# Patient Record
Sex: Male | Born: 1965 | Hispanic: Yes | Smoking: Never smoker
Health system: Southern US, Community
[De-identification: ages and names within clinical notes are randomized; demographics above are authoritative.]

## PROBLEM LIST (undated history)

## (undated) DIAGNOSIS — R69 Illness, unspecified: Secondary | ICD-10-CM

## (undated) DIAGNOSIS — F1020 Alcohol dependence, uncomplicated: Secondary | ICD-10-CM

## (undated) DIAGNOSIS — IMO0001 Reserved for inherently not codable concepts without codable children: Secondary | ICD-10-CM

## (undated) HISTORY — PX: OTHER SURGICAL HISTORY: SHX169

---

## 2010-01-05 ENCOUNTER — Emergency Department: Payer: Self-pay | Admitting: Internal Medicine

## 2011-02-10 ENCOUNTER — Emergency Department: Payer: Self-pay | Admitting: Emergency Medicine

## 2011-05-21 ENCOUNTER — Emergency Department: Payer: Self-pay | Admitting: *Deleted

## 2011-05-21 LAB — BASIC METABOLIC PANEL
BUN: 7 mg/dL (ref 7–18)
Calcium, Total: 8.8 mg/dL (ref 8.5–10.1)
Chloride: 104 mmol/L (ref 98–107)
Creatinine: 0.78 mg/dL (ref 0.60–1.30)
EGFR (African American): >60
EGFR (Non-African Amer.): >60
Glucose: 83 mg/dL (ref 65–99)
Osmolality: 286 (ref 275–301)
Potassium: 3.4 mmol/L — ABNORMAL LOW (ref 3.5–5.1)
Sodium: 145 mmol/L (ref 136–145)

## 2011-05-21 LAB — CBC
HCT: 39.9 % — ABNORMAL LOW (ref 40.0–52.0)
MCH: 20.3 pg — ABNORMAL LOW (ref 26.0–34.0)
Platelet: 248 10*3/uL (ref 150–440)
RDW: 18.9 % — ABNORMAL HIGH (ref 11.5–14.5)
WBC: 11.4 10*3/uL — ABNORMAL HIGH (ref 3.8–10.6)

## 2011-05-22 LAB — DRUG SCREEN, URINE
Barbiturates, Ur Screen: NEGATIVE (ref ?–200)
Benzodiazepine, Ur Scrn: NEGATIVE (ref ?–200)
Cannabinoid 50 Ng, Ur ~~LOC~~: NEGATIVE (ref ?–50)
Methadone, Ur Screen: NEGATIVE (ref ?–300)
Tricyclic, Ur Screen: NEGATIVE (ref ?–1000)

## 2011-05-22 LAB — URINALYSIS, COMPLETE
Bacteria: NONE SEEN
Bilirubin,UR: NEGATIVE
Glucose,UR: NEGATIVE mg/dL (ref 0–75)
Nitrite: NEGATIVE
Specific Gravity: 1.003 (ref 1.003–1.030)
Squamous Epithelial: NONE SEEN
WBC UR: <1 /HPF (ref 0–5)

## 2011-05-30 ENCOUNTER — Emergency Department: Payer: Self-pay | Admitting: Emergency Medicine

## 2011-05-30 LAB — URINALYSIS, COMPLETE
Bilirubin,UR: NEGATIVE
Blood: NEGATIVE
Glucose,UR: NEGATIVE mg/dL (ref 0–75)
Ketone: NEGATIVE
Ph: 5 (ref 4.5–8.0)
RBC,UR: <1 /HPF (ref 0–5)
Squamous Epithelial: NONE SEEN

## 2011-05-30 LAB — DRUG SCREEN, URINE
Amphetamines, Ur Screen: NEGATIVE (ref ?–1000)
Barbiturates, Ur Screen: NEGATIVE (ref ?–200)
Cannabinoid 50 Ng, Ur ~~LOC~~: NEGATIVE (ref ?–50)
Cocaine Metabolite,Ur ~~LOC~~: NEGATIVE (ref ?–300)
Methadone, Ur Screen: NEGATIVE (ref ?–300)
Opiate, Ur Screen: NEGATIVE (ref ?–300)
Phencyclidine (PCP) Ur S: NEGATIVE (ref ?–25)
Tricyclic, Ur Screen: NEGATIVE (ref ?–1000)

## 2011-05-30 LAB — CBC
HCT: 37.6 % — ABNORMAL LOW (ref 40.0–52.0)
HGB: 11.9 g/dL — ABNORMAL LOW (ref 13.0–18.0)
MCH: 20.6 pg — ABNORMAL LOW (ref 26.0–34.0)
MCV: 65 fL — ABNORMAL LOW (ref 80–100)
Platelet: 158 10*3/uL (ref 150–440)
RBC: 5.77 10*6/uL (ref 4.40–5.90)
WBC: 7.4 10*3/uL (ref 3.8–10.6)

## 2011-05-30 LAB — ETHANOL: Ethanol %: 0.383 % (ref 0.000–0.080)

## 2011-05-30 LAB — COMPREHENSIVE METABOLIC PANEL
Albumin: 4.1 g/dL (ref 3.4–5.0)
Alkaline Phosphatase: 53 U/L (ref 50–136)
Calcium, Total: 8.6 mg/dL (ref 8.5–10.1)
SGOT(AST): 34 U/L (ref 15–37)
SGPT (ALT): 19 U/L

## 2011-05-30 LAB — SALICYLATE LEVEL: Salicylates, Serum: <1.7 mg/dL

## 2011-05-30 LAB — TSH: Thyroid Stimulating Horm: 0.16 u[IU]/mL — ABNORMAL LOW

## 2011-05-30 LAB — ACETAMINOPHEN LEVEL: Acetaminophen: <2 ug/mL

## 2011-07-04 ENCOUNTER — Emergency Department: Payer: Self-pay | Admitting: Emergency Medicine

## 2011-07-05 LAB — ETHANOL: Ethanol: 474 mg/dL

## 2013-10-24 ENCOUNTER — Emergency Department: Payer: Self-pay | Admitting: Emergency Medicine

## 2013-10-24 LAB — URINALYSIS, COMPLETE
BACTERIA: NONE SEEN
BILIRUBIN, UR: NEGATIVE
Blood: NEGATIVE
Glucose,UR: NEGATIVE mg/dL (ref 0–75)
Ketone: NEGATIVE
Leukocyte Esterase: NEGATIVE
Nitrite: NEGATIVE
Ph: 6 (ref 4.5–8.0)
Protein: NEGATIVE
RBC,UR: NONE SEEN /HPF (ref 0–5)
SPECIFIC GRAVITY: 1.002 (ref 1.003–1.030)
SQUAMOUS EPITHELIAL: NONE SEEN
WBC UR: NONE SEEN /HPF (ref 0–5)

## 2013-10-24 LAB — CBC
HCT: 38.9 % — ABNORMAL LOW (ref 40.0–52.0)
HGB: 12.3 g/dL — ABNORMAL LOW (ref 13.0–18.0)
MCH: 22.8 pg — ABNORMAL LOW (ref 26.0–34.0)
MCHC: 31.7 g/dL — ABNORMAL LOW (ref 32.0–36.0)
MCV: 72 fL — ABNORMAL LOW (ref 80–100)
PLATELETS: 283 10*3/uL (ref 150–440)
RBC: 5.4 10*6/uL (ref 4.40–5.90)
RDW: 19.2 % — AB (ref 11.5–14.5)
WBC: 8.5 10*3/uL (ref 3.8–10.6)

## 2013-10-24 LAB — COMPREHENSIVE METABOLIC PANEL
ALBUMIN: 4 g/dL (ref 3.4–5.0)
ANION GAP: 14 (ref 7–16)
AST: 177 U/L — AB (ref 15–37)
Alkaline Phosphatase: 80 U/L
BILIRUBIN TOTAL: 0.3 mg/dL (ref 0.2–1.0)
BUN: 6 mg/dL — ABNORMAL LOW (ref 7–18)
CALCIUM: 8.5 mg/dL (ref 8.5–10.1)
Chloride: 98 mmol/L (ref 98–107)
Co2: 21 mmol/L (ref 21–32)
Creatinine: 0.76 mg/dL (ref 0.60–1.30)
EGFR (African American): >60
Glucose: 123 mg/dL — ABNORMAL HIGH (ref 65–99)
OSMOLALITY: 265 (ref 275–301)
Potassium: 3.3 mmol/L — ABNORMAL LOW (ref 3.5–5.1)
SGPT (ALT): 148 U/L — ABNORMAL HIGH
Sodium: 133 mmol/L — ABNORMAL LOW (ref 136–145)
TOTAL PROTEIN: 7.7 g/dL (ref 6.4–8.2)

## 2013-10-24 LAB — DRUG SCREEN, URINE
Amphetamines, Ur Screen: NEGATIVE (ref ?–1000)
BARBITURATES, UR SCREEN: NEGATIVE (ref ?–200)
Benzodiazepine, Ur Scrn: NEGATIVE (ref ?–200)
Cannabinoid 50 Ng, Ur ~~LOC~~: NEGATIVE (ref ?–50)
Cocaine Metabolite,Ur ~~LOC~~: NEGATIVE (ref ?–300)
MDMA (ECSTASY) UR SCREEN: NEGATIVE (ref ?–500)
Methadone, Ur Screen: NEGATIVE (ref ?–300)
Opiate, Ur Screen: NEGATIVE (ref ?–300)
Phencyclidine (PCP) Ur S: NEGATIVE (ref ?–25)
Tricyclic, Ur Screen: NEGATIVE (ref ?–1000)

## 2013-10-24 LAB — ETHANOL
ETHANOL %: 0.417 % — AB (ref 0.000–0.080)
Ethanol: 417 mg/dL

## 2014-05-07 ENCOUNTER — Emergency Department: Payer: Self-pay | Admitting: Emergency Medicine

## 2014-05-24 ENCOUNTER — Emergency Department: Payer: Self-pay | Admitting: Emergency Medicine

## 2014-09-25 ENCOUNTER — Emergency Department
Admission: EM | Admit: 2014-09-25 | Discharge: 2014-09-25 | Disposition: A | Discharge status: Home / Self Care | Payer: Self-pay | Attending: Emergency Medicine | Admitting: Emergency Medicine

## 2014-09-25 ENCOUNTER — Encounter: Payer: Self-pay | Admitting: Emergency Medicine

## 2014-09-25 DIAGNOSIS — F10120 Alcohol abuse with intoxication, uncomplicated: Secondary | ICD-10-CM | POA: Insufficient documentation

## 2014-09-25 DIAGNOSIS — F10920 Alcohol use, unspecified with intoxication, uncomplicated: Secondary | ICD-10-CM

## 2014-09-25 DIAGNOSIS — Z720 Tobacco use: Secondary | ICD-10-CM | POA: Insufficient documentation

## 2014-09-25 HISTORY — DX: Illness, unspecified: R69

## 2014-09-25 HISTORY — DX: Reserved for inherently not codable concepts without codable children: IMO0001

## 2014-09-25 HISTORY — DX: Alcohol dependence, uncomplicated: F10.20

## 2014-09-25 LAB — CBC
HCT: 36 % — ABNORMAL LOW (ref 40.0–52.0)
Hemoglobin: 11.3 g/dL — ABNORMAL LOW (ref 13.0–18.0)
MCH: 22.6 pg — ABNORMAL LOW (ref 26.0–34.0)
MCHC: 31.3 g/dL — ABNORMAL LOW (ref 32.0–36.0)
MCV: 72.1 fL — ABNORMAL LOW (ref 80.0–100.0)
Platelets: 147 10*3/uL — ABNORMAL LOW (ref 150–440)
RBC: 4.99 MIL/uL (ref 4.40–5.90)
RDW: 20.1 % — ABNORMAL HIGH (ref 11.5–14.5)
WBC: 7 10*3/uL (ref 3.8–10.6)

## 2014-09-25 LAB — COMPREHENSIVE METABOLIC PANEL
ALT: 53 U/L (ref 17–63)
AST: 194 U/L — AB (ref 15–41)
Albumin: 4.1 g/dL (ref 3.5–5.0)
Alkaline Phosphatase: 76 U/L (ref 38–126)
Anion gap: 12 (ref 5–15)
BUN: 8 mg/dL (ref 6–20)
CALCIUM: 8.7 mg/dL — AB (ref 8.9–10.3)
CO2: 25 mmol/L (ref 22–32)
CREATININE: 0.75 mg/dL (ref 0.61–1.24)
Chloride: 107 mmol/L (ref 101–111)
GFR calc Af Amer: >60 mL/min (ref >60)
Glucose, Bld: 107 mg/dL — ABNORMAL HIGH (ref 65–99)
Potassium: 4.1 mmol/L (ref 3.5–5.1)
Sodium: 144 mmol/L (ref 135–145)
TOTAL PROTEIN: 7.5 g/dL (ref 6.5–8.1)
Total Bilirubin: 0.7 mg/dL (ref 0.3–1.2)

## 2014-09-25 LAB — ACETAMINOPHEN LEVEL

## 2014-09-25 LAB — ETHANOL: Alcohol, Ethyl (B): 533 mg/dL (ref <5)

## 2014-09-25 LAB — SALICYLATE LEVEL: Salicylate Lvl: <4 mg/dL (ref 2.8–30.0)

## 2014-09-25 NOTE — ED Provider Notes (Signed)
Patient is ambulate well and is not clinically intoxicated, he is anxious to leave.  Jene Everyobert Pepper Kerrick, MD 09/25/14 1104

## 2014-09-25 NOTE — ED Notes (Signed)
Remains confused to time and place.  Speech is clearer.  Patient resting.

## 2014-09-25 NOTE — ED Notes (Signed)
Sleeping

## 2014-09-25 NOTE — ED Notes (Signed)
Through translator and Dr. Zenda AlpersWebster patient denies any medical complaints. States "I am drunk".  Would not state what or how much he has consumed.  Also stated that "I do not have a girlfriend".

## 2014-09-25 NOTE — Discharge Instructions (Signed)
Intoxicacin alcohlica (Alcohol Intoxication) La intoxicacin alcohlica se produce cuando la cantidad de alcohol que se ha consumido daa la capacidad de funcionamiento mental y fsico. El alcohol deteriora directamente la actividad qumica normal del cerebro. Beber grandes cantidades de alcohol puede conducir a Insurance underwriter funcionamiento mental y en el comportamiento, y puede causar muchos efectos fsicos que pueden ser perjudiciales.  La intoxicacin alcohlica puede variar en gravedad desde leve hasta muy grave. Hay varios factores que pueden afectar el nivel de intoxicacin que se produce, como la edad de la persona, el sexo, el peso, la frecuencia de consumo de alcohol, y la presencia de otras enfermedades mdicas (como diabetes, convulsiones o enfermedades del corazn). Los niveles peligrosos de intoxicacin por alcohol pueden ocurrir [employer] personas beben grandes cantidades de alcohol en un corto periodo de tiempo ([place]). El alcohol tambin puede ser especialmente peligroso cuando se combina con ciertos medicamentos recetados o drogas "recreativas". SIGNOS Y SNTOMAS Algunos de los signos y sntomas comunes de intoxicacin leve por alcohol incluyen:  Prdida de la coordinacin.  Cambios en el estado de nimo y la conducta.  Incapacidad para razonar.  Hablar arrastrando las palabras. A medida que la intoxicacin por alcohol avanza a niveles ms graves, Lucianne Lei a Arts administrator otros signos y sntomas. Estos pueden ser:  Vmitos.  Confusin y alteracin de Sales promotion account executive.  Disminucin de Secretary/administrator.  Convulsiones.  Prdida de la conciencia. DIAGNSTICO  El mdico le har una historia clnica y un examen fsico. Se le preguntar acerca de la cantidad y el tipo de alcohol que ha consumido. Se le realizarn anlisis de sangre para medir la concentracin de alcohol en sangre. En muchos lugares, el nivel de alcohol en la sangre debe ser inferior a 80 mg / dL (0,08%) para  poder conducir legalmente. Sin embargo, hay muchos efectos peligrosos del alcohol que pueden ocurrir con niveles mucho ms bajos.  [place] con intoxicacin por alcohol a menudo no requieren Clinical research associate. La mayor parte de los efectos de la intoxicacin por alcohol son temporales, y desaparecen a medida que el alcohol abandona el cuerpo de forma natural. El profesional controlar su estado hasta que est lo suficientemente estable como para volver a casa. A veces se administran lquidos por va intravenosa para ayudar a evitar la deshidratacin.  INSTRUCCIONES PARA EL CUIDADO EN EL HOGAR  No conduzca vehculos despus de beber alcohol.  Mantngase hidratado. Beba gran cantidad de lquido para mantener la orina de tono claro o color amarillo plido. Evite la cafena.   Tome slo medicamentos de venta libre o recetados, segn las indicaciones del mdico.  SOLICITE ATENCIN MDICA SI:   Tiene vmitos persistentes.   No mejora luego de [employer].  Se intoxica con alcohol con frecuencia. El mdico podr ayudarlo a decidir si debe consultar a un terapeuta especializado en el abuso de sustancias. SOLICITE ATENCIN MDICA DE INMEDIATO SI:   Se siente vacilante o tembloroso cuando trata de abandonar el hbito.   Comienza a temblar de manera incontrolable (convulsiones).   Vomita sangre. Puede ser sangre de color rojo brillante o similar al sedimento del caf negro.   Lollie Marrow en la materia fecal. Puede ser de color rojo brillante o de aspecto alquitranado, con olor ftido.   Se siente mareado o se desmaya.  ASEGRESE DE QUE:   Comprende estas instrucciones.  Controlar su afeccin.  Recibir ayuda de inmediato si no mejora o si empeora. Document Released: 03/07/2005 Document Revised: 11/07/2012 ExitCare Patient Information  2015 [employer]. This information is not intended to replace advice given to you by your health care provider. Make sure you  discuss any questions you have with your health care provider.
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
## 2014-12-13 ENCOUNTER — Encounter: Payer: Self-pay | Admitting: Emergency Medicine

## 2014-12-13 ENCOUNTER — Emergency Department: Payer: Self-pay

## 2014-12-13 ENCOUNTER — Emergency Department
Admission: EM | Admit: 2014-12-13 | Discharge: 2014-12-13 | Disposition: A | Discharge status: Home / Self Care | Payer: Self-pay | Attending: Student | Admitting: Student

## 2014-12-13 DIAGNOSIS — K852 Alcohol induced acute pancreatitis without necrosis or infection: Secondary | ICD-10-CM

## 2014-12-13 DIAGNOSIS — F10920 Alcohol use, unspecified with intoxication, uncomplicated: Secondary | ICD-10-CM

## 2014-12-13 DIAGNOSIS — F10120 Alcohol abuse with intoxication, uncomplicated: Secondary | ICD-10-CM | POA: Insufficient documentation

## 2014-12-13 DIAGNOSIS — R4781 Slurred speech: Secondary | ICD-10-CM | POA: Insufficient documentation

## 2014-12-13 LAB — COMPREHENSIVE METABOLIC PANEL WITH GFR
ALT: 32 U/L (ref 17–63)
AST: 60 U/L — ABNORMAL HIGH (ref 15–41)
Albumin: 4.4 g/dL (ref 3.5–5.0)
Alkaline Phosphatase: 80 U/L (ref 38–126)
Anion gap: 10 (ref 5–15)
BUN: 7 mg/dL (ref 6–20)
CO2: 23 mmol/L (ref 22–32)
Calcium: 9.1 mg/dL (ref 8.9–10.3)
Chloride: 108 mmol/L (ref 101–111)
Creatinine, Ser: 0.7 mg/dL (ref 0.61–1.24)
GFR calc Af Amer: >60 mL/min
GFR calc non Af Amer: >60 mL/min
Glucose, Bld: 101 mg/dL — ABNORMAL HIGH (ref 65–99)
Potassium: 3.4 mmol/L — ABNORMAL LOW (ref 3.5–5.1)
Sodium: 141 mmol/L (ref 135–145)
Total Bilirubin: 0.3 mg/dL (ref 0.3–1.2)
Total Protein: 8.1 g/dL (ref 6.5–8.1)

## 2014-12-13 LAB — CBC WITH DIFFERENTIAL/PLATELET
BASOS ABS: 0.1 10*3/uL (ref 0–0.1)
Basophils Relative: 1 %
Eosinophils Absolute: 1.4 10*3/uL — ABNORMAL HIGH (ref 0–0.7)
Eosinophils Relative: 15 %
HEMATOCRIT: 39 % — AB (ref 40.0–52.0)
HEMOGLOBIN: 12.3 g/dL — AB (ref 13.0–18.0)
LYMPHS ABS: 3.4 10*3/uL (ref 1.0–3.6)
LYMPHS PCT: 36 %
MCH: 23.1 pg — ABNORMAL LOW (ref 26.0–34.0)
MCHC: 31.6 g/dL — ABNORMAL LOW (ref 32.0–36.0)
MCV: 73.2 fL — AB (ref 80.0–100.0)
Monocytes Absolute: 0.7 10*3/uL (ref 0.2–1.0)
Monocytes Relative: 7 %
NEUTROS ABS: 3.9 10*3/uL (ref 1.4–6.5)
Neutrophils Relative %: 41 %
Platelets: 243 10*3/uL (ref 150–440)
RBC: 5.33 MIL/uL (ref 4.40–5.90)
RDW: 19.7 % — ABNORMAL HIGH (ref 11.5–14.5)
WBC: 9.5 10*3/uL (ref 3.8–10.6)

## 2014-12-13 LAB — LIPASE, BLOOD: Lipase: 289 U/L — ABNORMAL HIGH (ref 22–51)

## 2014-12-13 LAB — ETHANOL: Alcohol, Ethyl (B): 463 mg/dL

## 2014-12-13 MED ORDER — SODIUM CHLORIDE 0.9 % IV BOLUS (SEPSIS)
1000.0000 mL | Freq: Once | INTRAVENOUS | Status: AC
  Administered 2014-12-13: 1000 mL via INTRAVENOUS

## 2014-12-13 NOTE — ED Notes (Signed)
With assistance from Rafeal, translator, triage completed. ---- Patient presents to Emergency Department via EMS.  Pt found by [facility] on [address] sidewalk.  Pt denies falling, pt denies pain, pt reports drinking "1 or 2" beers, pt primary language unknown, interacts with Sherilyn Cooter, RN, pt appears extremely intoxicated and per EMS has no identification.  Pt won't report name or birthdate or any other information easily.

## 2014-12-13 NOTE — ED Notes (Signed)
Pt to toilet with assistance

## 2014-12-13 NOTE — ED Notes (Signed)
Patient is Alert but not oriented to Place or Event. Dr. Inocencio Homes at bedside. Patient is ambulatory without difficulty

## 2014-12-13 NOTE — ED Notes (Signed)
BEHAVIORAL HEALTH ROUNDING Patient sleeping: No. Patient alert and oriented: no Behavior appropriate: Yes.  ; If no, describe: See Note Nutrition and fluids offered: Yes  Toileting and hygiene offered: Yes  Sitter present: yes Law enforcement present: Yes  ENVIRONMENTAL ASSESSMENT Potentially harmful objects out of patient reach: Yes.   Personal belongings secured: Yes.   Patient dressed in hospital provided attire only: Yes.   Plastic bags out of patient reach: Yes.   Patient care equipment (cords, cables, call bells, lines, and drains) shortened, removed, or accounted for: Yes.   Equipment and supplies removed from bottom of stretcher: Yes.   Potentially toxic materials out of patient reach: Yes.   Sharps container removed or out of patient reach: Yes.

## 2014-12-13 NOTE — ED Provider Notes (Signed)
-----------------------------------------   8:36 AM on 12/13/2014 ----------------------------------------- Care assumed from Dr. Dolores Frame at 7:15 AM. Briefly, this is a 49 year old male who presented with acute alcohol intoxication. The patient is now awake, ambulatory, alert and oriented 4. He appears clinically sober. Discussed return precautions with the use of the spanish interpreter, counseled on alcohol cessation, DC with return precautions. Lipase was elevated on arrival however the patient is tolerating by mouth intake, he has no abdominal pain, no vomiting, no evidence to suggest clinically significant pancreatitis.  Gayla Doss, MD 12/13/14 [phone]

## 2014-12-13 NOTE — ED Notes (Signed)
Patient is A+Ox4. MD Inocencio Homes at bedside.
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
## 2014-12-13 NOTE — ED Notes (Signed)
Patient transported to CT 

## 2014-12-13 NOTE — ED Notes (Signed)
Pt filled urinal sitting with assistance

## 2014-12-13 NOTE — ED Notes (Signed)
BEHAVIORAL HEALTH ROUNDING Patient sleeping: No. Patient alert and oriented: No (Disoriented to Place) Behavior appropriate: Yes.  ; If no, describe: n/a Nutrition and fluids offered: Yes  Toileting and hygiene offered: Yes  Sitter present: yes Law enforcement present: Yes

## 2014-12-13 NOTE — ED Notes (Signed)
Dr Dolores Frame notified: 463 ETOH level

## 2014-12-14 ENCOUNTER — Emergency Department
Admission: EM | Admit: 2014-12-14 | Discharge: 2014-12-14 | Disposition: A | Discharge status: Home / Self Care | Payer: Self-pay | Attending: Emergency Medicine | Admitting: Emergency Medicine

## 2014-12-14 DIAGNOSIS — K852 Alcohol induced acute pancreatitis without necrosis or infection: Secondary | ICD-10-CM

## 2014-12-14 DIAGNOSIS — F10120 Alcohol abuse with intoxication, uncomplicated: Secondary | ICD-10-CM | POA: Insufficient documentation

## 2014-12-14 DIAGNOSIS — F10920 Alcohol use, unspecified with intoxication, uncomplicated: Secondary | ICD-10-CM

## 2014-12-14 DIAGNOSIS — K2920 Alcoholic gastritis without bleeding: Secondary | ICD-10-CM | POA: Insufficient documentation

## 2014-12-14 LAB — CBC
HEMATOCRIT: 37.2 % — AB (ref 40.0–52.0)
Hemoglobin: 11.7 g/dL — ABNORMAL LOW (ref 13.0–18.0)
MCH: 23.1 pg — ABNORMAL LOW (ref 26.0–34.0)
MCHC: 31.6 g/dL — ABNORMAL LOW (ref 32.0–36.0)
MCV: 73 fL — AB (ref 80.0–100.0)
PLATELETS: 220 10*3/uL (ref 150–440)
RBC: 5.09 MIL/uL (ref 4.40–5.90)
RDW: 20.3 % — AB (ref 11.5–14.5)
WBC: 6.2 10*3/uL (ref 3.8–10.6)

## 2014-12-14 LAB — LIPASE, BLOOD: LIPASE: 244 U/L — AB (ref 22–51)

## 2014-12-14 LAB — COMPREHENSIVE METABOLIC PANEL
ALT: 30 U/L (ref 17–63)
ANION GAP: 10 (ref 5–15)
AST: 64 U/L — AB (ref 15–41)
Albumin: 4.3 g/dL (ref 3.5–5.0)
Alkaline Phosphatase: 67 U/L (ref 38–126)
BILIRUBIN TOTAL: 0.4 mg/dL (ref 0.3–1.2)
BUN: 6 mg/dL (ref 6–20)
CHLORIDE: 112 mmol/L — AB (ref 101–111)
CO2: 20 mmol/L — ABNORMAL LOW (ref 22–32)
Calcium: 8.5 mg/dL — ABNORMAL LOW (ref 8.9–10.3)
Creatinine, Ser: 0.67 mg/dL (ref 0.61–1.24)
Glucose, Bld: 106 mg/dL — ABNORMAL HIGH (ref 65–99)
POTASSIUM: 3.6 mmol/L (ref 3.5–5.1)
Sodium: 142 mmol/L (ref 135–145)
TOTAL PROTEIN: 7.9 g/dL (ref 6.5–8.1)

## 2014-12-14 LAB — ETHANOL: ALCOHOL ETHYL (B): 473 mg/dL — AB (ref <5)

## 2014-12-14 MED ORDER — GI COCKTAIL ~~LOC~~: 30.0000 mL | Freq: Once | ORAL | Status: DC

## 2014-12-14 MED ORDER — SODIUM CHLORIDE 0.9 % IV BOLUS (SEPSIS)
1000.0000 mL | Freq: Once | INTRAVENOUS | Status: AC
  Administered 2014-12-14: 1000 mL via INTRAVENOUS

## 2014-12-14 NOTE — ED Notes (Signed)
Pt informed to return if any life threatening symptoms occur.  
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
## 2014-12-14 NOTE — ED Provider Notes (Signed)
Patient's care was signed out to Dr. Scotty Court.  Rebecka Apley, MD 12/14/14 [phone]

## 2014-12-14 NOTE — ED Notes (Signed)
BEHAVIORAL HEALTH ROUNDING Patient sleeping: Yes.   Patient alert and oriented: not applicable Behavior appropriate: Yes.  ; If no, describe: sleeping Nutrition and fluids offered: No Toileting and hygiene offered: No Sitter present: yes Law enforcement present: Yes   ENVIRONMENTAL ASSESSMENT Potentially harmful objects out of patient reach: Yes.   Personal belongings secured: Yes.   Patient dressed in hospital provided attire only: Yes.   Plastic bags out of patient reach: Yes.   Patient care equipment (cords, cables, call bells, lines, and drains) shortened, removed, or accounted for: Yes.   Equipment and supplies removed from bottom of stretcher: Yes.   Potentially toxic materials out of patient reach: Yes.   Sharps container removed or out of patient reach: Yes.    

## 2014-12-14 NOTE — ED Provider Notes (Signed)
-----------------------------------------   9:17 AM on 12/14/2014 -----------------------------------------  Patient awake alert oriented. Vital signs remained stable and normal. Speaks with clear speech, clear cognition. Interactive appropriately. Walks with steady gait. Tolerating oral intake and drinking coffee. At this point it appears he is clinically sober and medically clear. Does not appear that he has any clinically significant pancreatitis and likely his complaints were related to gastritis from his excessive alcohol intake. We'll discharge home with resources to follow-up to seek help with his alcohol abuse when he is ready.  Final diagnoses:  Alcohol intoxication, uncomplicated  Alcohol-induced acute pancreatitis  alcoholic gastritis   Sharman Cheek, MD 12/14/14 [phone]

## 2014-12-14 NOTE — ED Notes (Signed)
Pt is awake and walking down hall without difficulty.

## 2014-12-14 NOTE — Discharge Instructions (Signed)
Intoxicacin alcohlica (Alcohol Intoxication) La intoxicacin alcohlica se produce cuando la cantidad de alcohol que se ha consumido daa la capacidad de funcionamiento mental y fsico. El alcohol deteriora directamente la actividad qumica normal del cerebro. Beber grandes cantidades de alcohol puede conducir a Insurance underwriter funcionamiento mental y en el comportamiento, y puede causar muchos efectos fsicos que pueden ser perjudiciales.  La intoxicacin alcohlica puede variar en gravedad desde leve hasta muy grave. Hay varios factores que pueden afectar el nivel de intoxicacin que se produce, como la edad de la persona, el sexo, el peso, la frecuencia de consumo de alcohol, y la presencia de otras enfermedades mdicas (como diabetes, convulsiones o enfermedades del corazn). Los niveles peligrosos de intoxicacin por alcohol pueden ocurrir [employer] personas beben grandes cantidades de alcohol en un corto periodo de tiempo ([place]). El alcohol tambin puede ser especialmente peligroso cuando se combina con ciertos medicamentos recetados o drogas "recreativas". SIGNOS Y SNTOMAS Algunos de los signos y sntomas comunes de intoxicacin leve por alcohol incluyen:  Prdida de la coordinacin.  Cambios en el estado de nimo y la conducta.  Incapacidad para razonar.  Hablar arrastrando las palabras. A medida que la intoxicacin por alcohol avanza a niveles ms graves, Lucianne Lei a Arts administrator otros signos y sntomas. Estos pueden ser:  Vmitos.  Confusin y alteracin de Sales promotion account executive.  Disminucin de Secretary/administrator.  Convulsiones.  Prdida de la conciencia. DIAGNSTICO  El mdico le har una historia clnica y un examen fsico. Se le preguntar acerca de la cantidad y el tipo de alcohol que ha consumido. Se le realizarn anlisis de sangre para medir la concentracin de alcohol en sangre. En muchos lugares, el nivel de alcohol en la sangre debe ser inferior a 80 mg / dL (0,08%) para  poder conducir legalmente. Sin embargo, hay muchos efectos peligrosos del alcohol que pueden ocurrir con niveles mucho ms bajos.  [place] con intoxicacin por alcohol a menudo no requieren Clinical research associate. La mayor parte de los efectos de la intoxicacin por alcohol son temporales, y desaparecen a medida que el alcohol abandona el cuerpo de forma natural. El profesional controlar su estado hasta que est lo suficientemente estable como para volver a casa. A veces se administran lquidos por va intravenosa para ayudar a evitar la deshidratacin.  INSTRUCCIONES PARA EL CUIDADO EN EL HOGAR  No conduzca vehculos despus de beber alcohol.  Mantngase hidratado. Beba gran cantidad de lquido para mantener la orina de tono claro o color amarillo plido. Evite la cafena.   Tome slo medicamentos de venta libre o recetados, segn las indicaciones del mdico.  SOLICITE ATENCIN MDICA SI:   Tiene vmitos persistentes.   No mejora luego de [employer].  Se intoxica con alcohol con frecuencia. El mdico podr ayudarlo a decidir si debe consultar a un terapeuta especializado en el abuso de sustancias. SOLICITE ATENCIN MDICA DE INMEDIATO SI:   Se siente vacilante o tembloroso cuando trata de abandonar el hbito.   Comienza a temblar de manera incontrolable (convulsiones).   Vomita sangre. Puede ser sangre de color rojo brillante o similar al sedimento del caf negro.   Lollie Marrow en la materia fecal. Puede ser de color rojo brillante o de aspecto alquitranado, con olor ftido.   Se siente mareado o se desmaya.  ASEGRESE DE QUE:   Comprende estas instrucciones.  Controlar su afeccin.  Recibir ayuda de inmediato si no mejora o si empeora. Document Released: 03/07/2005 Document Revised: 11/07/2012 ExitCare Patient Information  2015 [employer]. This information is not intended to replace advice given to you by your health care provider. Make sure you  discuss any questions you have with your health care provider.  Trastorno por abuso de alcohol (Alcohol Use Disorder) El trastorno por abuso de alcohol es un trastorno mental. No se trata de un incidente nico en el que se bebe en abundancia. Este trastorno se debe al consumo incontrolable del alcohol a lo largo del [place], que causa dificultades en una o ms reas de la vida diaria. Al consumir en exceso, las personas que sufren este trastorno estn en riesgo de daarse a s mismos y a Lobbyist. Tambin puede causar otros trastornos [place], como trastornos del [place] de nimo y trastornos de [place], y 59 fsicos graves. Las personas que sufren trastornos por consumo de alcohol, generalmente abusan de otras sustancias.  Los trastornos por consumo de alcohol son frecuentes y se han generalizado. Algunas personas beben alcohol para poder enfrentarse o escapar a las situaciones negativas de la vida. Otros beben para Best boy crnico o los sntomas de una enfermedad mental. Las personas con historia familiar de trastornos por consumo de alcohol tienen ms riesgo de perder el control y de consumir alcohol en exceso.  SNTOMAS  Los signos y sntomas pueden ser:   Consumo dealcohol engrandes cantidades o durante perodos ms prolongados que lo previsto.  Mltiples intentos fallidos de dejar de bebero controlar el consumo de alcohol.   Emplear gran cantidad de tiempo para obtener, consumir o recuperarse de los efectos del alcohol (resaca).  Un deseo intenso o necesidad de consumir alcohol (abstinencia).   Continuar consumiendo alcohol a pesar de los [employer], en la escuela o en el hogar debido al consumo.   Continuar consumiendo a Arboriculturist de [employer] de relacin debido al consumo.  Continuar consumiendo an en situaciones que impliquen un peligro fsico, como al conducir un vehculo.  Continuar consumiendo a pesar de saber que un problema fsico o  psicolgico probablemente se relaciones con el consumo de alcohol. Los problemas fsicos relacionados con el consumo de alcohol pueden producirse en el cerebro, el corazn, el hgado, el estmago y los intestinos. Los problemas psicolgicos relacionados con el consumo de alcohol incluyen intoxicacin, depresin, ansiedad, psicosis, delirio y Air cabin crew.   La necesidad de aumentar la cantidad de alcohol para Control and instrumentation engineer deseado, o una disminucin del efecto al consumir la misma cantidad de alcohol (tolerancia).  Sndrome de abstinencia al reducir o Architectural technologist consumo, o tener que consumir alcohol para reducir o English as a second language teacher sndrome de abstinencia. Los sntomas de la abstinencia incluyen:  Frecuencia cardaca acelerada.  Temblores en las manos.  Dificultad para dormir.  Nuseas.  Vmitos.  Alucinaciones.  Agitacin.  Convulsiones. DIAGNSTICO  El mdico es el encargado de diagnosticar el trastorno por consumo de alcohol. Comenzar hacindole tres o cuatro preguntas para evaluar si consume alcohol en forma excesiva o si representa un problema. Para confirmar el diagnstico de trastorno por consumo de alcohol, deben estar presentes al [place] un perodo de 12 meses. La gravedad del trastorno depende del nmero de sntomas:   Leve--dos o tres.  Moderado--cuatro o cinco.  Grave--seis o ms. El mdico podr Water quality scientist un examen fsico o [employer] los [place] de las pruebas de laboratorio para ver si usted tiene problemas fsicos como resultado del consumo de alcohol. Podr derivarlo a Civil Service fast streamer de la salud mental para que realice una evaluacin.  TRATAMIENTO  Algunas  personas podrn reducir Psychologist, forensic a niveles en los que haya un bajo riesgo. Otras personas deben dejar de beber. Cuando sea necesario, podr recibir ayuda de los profesionales de la salud mental, capacitados en el tratamiento del abuso de sustancias. El mdico podr informarlo sobre la gravedad de su  trastorno por consumo de alcohol y decidir qu tipo de tratamiento necesita. Las formas de tratamiento disponibles son:   Desintoxicacin. La desintoxicacin consiste en el uso de medicamentos recetados para evitar el sndrome de abstinencia durante la primera semana en la que se deja de consumir. Esto es importante para las personas con historia de sndrome de abstinencia y para los que consumen en exceso, quienes probablemente sufran el sndrome de abstinencia. El sndrome de abstinencia puede ser peligroso y en algunos casos puede causar la [place]. La desintoxicacin generalmente se realiza internado en un hospital o en una institucin para pacientes que abusan de sustancias.  Asesoramiento psicolgico o psicoterapia. La psicoterapia la realizan terapeutas especializados en el tratamiento de pacientes que abusan de sustancias. Se evalan los motivos que llevan a consumir alcohol, y los modos para Furniture conservator/restorer a consumir. Los [employer] de la psicoterapia son ayudar a que las personas con trastornos por consumo de alcohol encuentren actividades saludables y [employer] de Radio broadcast assistant estrs de la vida, a identificar y Programmer, applications disparadores que originan el consumo de alcohol y a Scientist, physiological abstinencia, que puede originar una recada.  Medicamentos.Hay diferentes medicamentos que pueden ayudar a tratar el trastorno por consumo de alcohol a travs de los siguientes efectos:  Controlan la ansiedad por consumir.  Disminuyen la respuesta de recompensa positiva que se siente al consumir.  Causan una reaccin fsica desagradable cuando se consume alcohol (terapia por aversin).  Grupos de apoyo. Los grupos de apoyo son dirigidos por personas que han dejado de consumir. Ellos brindan apoyo emocional, consejo y [place]. Estas formas de tratamiento generalmente se combinan. Algunas personas se benefician con el tratamiento combinado intensivo que se ofrece en algunos centros de tratamiento especializados en el  abuso de sustancias. Existen programas para pacientes hospitalizados o ambulatorios.  Document Released: 03/07/2005 Document Revised: 07/22/2013 [facility] Patient Information 2015 [place], [place]. This information is not intended to replace advice given to you by your health care provider. Make sure you discuss any questions you have with your health care provider.  Pancreatitis aguda  (Acute Pancreatitis)  La pancreatitis aguda es una enfermedad en la que el pncreas se irrita (inflama) de manera sbita. El pncreas es una glndula grande ubicada detrs del [place]. Produce enzimas que ayudan a digerir los alimentos. Tambin produce 2 tipos de hormonas que controlan el nivel de Banker. La pancreatitis aguda se produce cuando las enzimas atacan y daan el pncreas. La mayor parte de los ataques duran un par de [address]  [address] y pueden ocasionar problemas graves. CUIDADOS EN EL HOGAR   Siga las instrucciones de su mdico sobre la dieta. Es necesario que evite el consumo de alcohol y grasas en la dieta.  Consuma pequeas cantidades de comida con ms frecuencia.  Beba gran cantidad de lquido para mantener el pis (orina) de tono claro o amarillo plido.  Tome slo los medicamentos que le haya indicado el mdico.  Evite el consumo de alcohol si esta fue la causa de su enfermedad.  No fume.  Haga reposo.  Controle su nivel de Production assistant, radio en su casa tal como le indic el mdico.  [place] con los controles mdicos segn las indicaciones. SOLICITE  AYUDA SI:  No mejora en el tiempo en que se esperaba.  Tiene sntomas nuevos o graves.  Tiene dolor persistente, debilidad o siente ganas de vomitar (nuseas).  Comienza a mejorar y sufre un nuevo ataque de Engineer, mining. SOLICITE AYUDA DE INMEDIATO SI:   No puede comer o retener los lquidos.  El dolor se hace ms intenso.  Tiene fiebre o sntomas que persisten durante ms de 2 o 3 das.  Tiene fiebre y los sntomas empeoran  bruscamente.  Nota que la piel o la zona blanca del ojo estn amarillas (ictericia).  Vomita.  Se siente mareado o se desvanece (se desmaya).  Su nivel de azcar en sangre es alto (ms de 300 mg/dL). ASEGRESE DE QUE:   Comprende estas instrucciones.  Controlar su enfermedad.  Solicitar ayuda de inmediato si no mejora o si empeora. Document Released: 09/06/2011 Document Revised: 07/22/2013 [facility] Patient Information 2015 [place], [place]. This information is not intended to replace advice given to you by your health care provider. Make sure you discuss any questions you have with your health care provider.

## 2014-12-14 NOTE — ED Provider Notes (Signed)
[facility] Emergency Department Provider Note  ____________________________________________  Time seen: Approximately 220 AM  I have reviewed the triage vital signs and the nursing notes.   HISTORY  Chief Complaint Alcohol Intoxication  History limited by patient alcohol intoxication  HPI Justin Schneider is a 49 y.o. male who was seen last night with alcohol intoxication and returns tonight with the same. According to the police the patient was found sleeping in someone's front yard. The patient had some slurred speech and had numerous empty beer cans around him. The patient was brought in for evaluation. He reports that he does have some upper epigastric/left upper quadrant pain he is not suicidal or homicidal at this time. The patient is very intoxicated and unable to give a history.   Past Medical History  Diagnosis Date  . Unknown and unspecified causes of morbidity   . Alcoholism /alcohol abuse     There are no active problems to display for this patient.   Past Surgical History  Procedure Laterality Date  . Unknown      No current outpatient prescriptions on file.  Allergies Review of patient's allergies indicates no known allergies.  No family history on file.  Social History Social History  Substance Use Topics  . Smoking status: Unknown If Ever Smoked  . Smokeless tobacco: Not on file  . Alcohol Use: Yes    Review of Systems  Gastrointestinal: Abdominal pain nausea and vomiting The patient is intoxicated and unable to give a good review of systems.  ____________________________________________   PHYSICAL EXAM:  VITAL SIGNS: ED Triage Vitals  Enc Vitals Group     BP 12/14/14 0056 125/85 mmHg     Pulse Rate 12/14/14 0441 84     Resp 12/14/14 0056 18     Temp 12/14/14 0056 98 F (36.7 C)     Temp Source 12/14/14 0056 Oral     SpO2 12/14/14 0056 96 %     Weight 12/14/14 0056 134 lb 7.7 oz (61 kg)     Height 12/14/14  0056  (1.6 m)     Head Cir --      Peak Flow --      Pain Score 12/14/14 0059 10     Pain Loc --      Pain Edu? --      Excl. in GC? --     Constitutional: Somnolent but arousable and has difficulty participating in history Eyes: Conjunctivae are normal. PERRL. EOMI. Head: Atraumatic. Nose: No congestion/rhinnorhea. Mouth/Throat: Mucous membranes are moist.  Oropharynx non-erythematous. Cardiovascular: Normal rate, regular rhythm. Grossly normal heart sounds.  Good peripheral circulation. Respiratory: Normal respiratory effort.  No retractions. Lungs CTAB. Gastrointestinal: Soft with epigastric and left upper quadrant tenderness to palpation No distention. Positive bowel sounds Musculoskeletal: No lower extremity tenderness nor edema.  . Neurologic:  Normal speech and language.  Skin:  Skin is warm, dry and intact.  Psychiatric: Mood and affect are normal.   ____________________________________________   LABS (all labs ordered are listed, but only abnormal results are displayed)  Labs Reviewed  CBC - Abnormal; Notable for the following:    Hemoglobin 11.7 (*)    HCT 37.2 (*)    MCV 73.0 (*)    MCH 23.1 (*)    MCHC 31.6 (*)    RDW 20.3 (*)    All other components within normal limits  COMPREHENSIVE METABOLIC PANEL - Abnormal; Notable for the following:    Chloride 112 (*)  CO2 20 (*)    Glucose, Bld 106 (*)    Calcium 8.5 (*)    AST 64 (*)    All other components within normal limits  ETHANOL - Abnormal; Notable for the following:    Alcohol, Ethyl (B) 473 (*)    All other components within normal limits  LIPASE, BLOOD - Abnormal; Notable for the following:    Lipase 244 (*)    All other components within normal limits   ____________________________________________  EKG  None ____________________________________________  RADIOLOGY  None ____________________________________________   PROCEDURES  Procedure(s) performed: None  Critical Care  performed: No  ____________________________________________   INITIAL IMPRESSION / ASSESSMENT AND PLAN / ED COURSE  Pertinent labs & imaging results that were available during my care of the patient were reviewed by me and considered in my medical decision making (see chart for details).  This is a 49 year old male who comes in today with alcohol intoxication. The patient was seen yesterday with the same thing and discharged in the day after he metabolizes alcohol. The patient does have some epigastric pain and I did write for some GI cocktail but he was able to sleep in the emergency department. When the patient is awake we'll reassess him to determine if he is having worse pain or worsening symptoms of his pancreatitis.  The patient's care will be signed out to Dr. Derrill Kay who will reassess the patient once he has sobered. ____________________________________________   FINAL CLINICAL IMPRESSION(S) / ED DIAGNOSES  Final diagnoses:  Alcohol intoxication, uncomplicated  Alcohol-induced acute pancreatitis      Rebecka Apley, MD 12/14/14 [phone]

## 2014-12-14 NOTE — ED Notes (Signed)
Pt up to the bathroom with rover at side; back to bed; no requests or complaints

## 2016-02-07 ENCOUNTER — Emergency Department
Admission: EM | Admit: 2016-02-07 | Discharge: 2016-02-08 | Disposition: A | Discharge status: Home / Self Care | Payer: Self-pay | Attending: Emergency Medicine | Admitting: Emergency Medicine

## 2016-02-07 ENCOUNTER — Emergency Department: Payer: Self-pay

## 2016-02-07 ENCOUNTER — Encounter: Payer: Self-pay | Admitting: *Deleted

## 2016-02-07 DIAGNOSIS — Y92488 Other paved roadways as the place of occurrence of the external cause: Secondary | ICD-10-CM | POA: Insufficient documentation

## 2016-02-07 DIAGNOSIS — F10920 Alcohol use, unspecified with intoxication, uncomplicated: Secondary | ICD-10-CM

## 2016-02-07 DIAGNOSIS — Z23 Encounter for immunization: Secondary | ICD-10-CM | POA: Insufficient documentation

## 2016-02-07 DIAGNOSIS — Y999 Unspecified external cause status: Secondary | ICD-10-CM | POA: Insufficient documentation

## 2016-02-07 DIAGNOSIS — W1839XA Other fall on same level, initial encounter: Secondary | ICD-10-CM | POA: Insufficient documentation

## 2016-02-07 DIAGNOSIS — F10120 Alcohol abuse with intoxication, uncomplicated: Secondary | ICD-10-CM | POA: Insufficient documentation

## 2016-02-07 DIAGNOSIS — Y939 Activity, unspecified: Secondary | ICD-10-CM | POA: Insufficient documentation

## 2016-02-07 DIAGNOSIS — S0083XA Contusion of other part of head, initial encounter: Secondary | ICD-10-CM | POA: Insufficient documentation

## 2016-02-07 LAB — COMPREHENSIVE METABOLIC PANEL
ALBUMIN: 4.2 g/dL (ref 3.5–5.0)
ALT: 61 U/L (ref 17–63)
ANION GAP: 10 (ref 5–15)
AST: 113 U/L — ABNORMAL HIGH (ref 15–41)
Alkaline Phosphatase: 99 U/L (ref 38–126)
BUN: 7 mg/dL (ref 6–20)
CO2: 26 mmol/L (ref 22–32)
Calcium: 8.6 mg/dL — ABNORMAL LOW (ref 8.9–10.3)
Chloride: 97 mmol/L — ABNORMAL LOW (ref 101–111)
Creatinine, Ser: 0.71 mg/dL (ref 0.61–1.24)
GFR calc Af Amer: >60 mL/min (ref >60)
GFR calc non Af Amer: 55 mL/min — ABNORMAL LOW (ref >60)
GLUCOSE: 118 mg/dL — AB (ref 65–99)
POTASSIUM: 3.4 mmol/L — AB (ref 3.5–5.1)
SODIUM: 133 mmol/L — AB (ref 135–145)
TOTAL PROTEIN: 8.2 g/dL — AB (ref 6.5–8.1)
Total Bilirubin: 0.5 mg/dL (ref 0.3–1.2)

## 2016-02-07 LAB — CBC WITH DIFFERENTIAL/PLATELET
BASOS ABS: 0.1 10*3/uL (ref 0–0.1)
Basophils Relative: 1 %
EOS ABS: 1 10*3/uL — AB (ref 0–0.7)
Eosinophils Relative: 10 %
HCT: 36.2 % — ABNORMAL LOW (ref 40.0–52.0)
HEMOGLOBIN: 11.8 g/dL — AB (ref 13.0–18.0)
LYMPHS ABS: 4 10*3/uL — AB (ref 1.0–3.6)
LYMPHS PCT: 40 %
MCH: 23.2 pg — AB (ref 26.0–34.0)
MCHC: 32.6 g/dL (ref 32.0–36.0)
MCV: 71.1 fL — ABNORMAL LOW (ref 80.0–100.0)
Monocytes Absolute: 0.9 10*3/uL (ref 0.2–1.0)
Monocytes Relative: 9 %
NEUTROS PCT: 40 %
Neutro Abs: 4.2 10*3/uL (ref 1.4–6.5)
Platelets: 217 10*3/uL (ref 150–440)
RBC: 5.09 MIL/uL (ref 4.40–5.90)
RDW: 18.8 % — ABNORMAL HIGH (ref 11.5–14.5)
WBC: 10.2 10*3/uL (ref 3.8–10.6)

## 2016-02-07 LAB — LIPASE, BLOOD: LIPASE: 141 U/L — AB (ref 11–51)

## 2016-02-07 LAB — ETHANOL: Alcohol, Ethyl (B): 481 mg/dL (ref <5)

## 2016-02-07 MED ORDER — TETANUS-DIPHTH-ACELL PERTUSSIS 5-2.5-18.5 LF-MCG/0.5 IM SUSP
0.5000 mL | Freq: Once | INTRAMUSCULAR | Status: AC
  Administered 2016-02-07: 0.5 mL via INTRAMUSCULAR
  Filled 2016-02-07: qty 0.5

## 2016-02-07 NOTE — ED Notes (Signed)
[place] from [place]. Pt placed on cont pox and blood pressure to cycle every 30 minutes. Pt looks in rn's direction when rn speaks to pt, but just makes kissing noises with his mouth, no other verbal response noted. 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 Warm blankets provided, skin normal color warm and dry, resps unlabored.

## 2016-02-07 NOTE — ED Notes (Addendum)
Pt assisted with urinal. Pt states his name is Justin Schneider, will contact interpreter for registration. Pt with unlabored resps. siderails up x2, bed in low position, non skid socks on feet. Call bell at left side.

## 2016-02-07 NOTE — ED Triage Notes (Signed)
Pt arrives via EMS, pt was found on side of road, abrasion to left upper cheek, pt spanish speaking, at present pt not answering any questions, pt appears altered or intoxicated, interpreter brought to bedside, EDP at bedside

## 2016-02-07 NOTE — ED Notes (Signed)
BEHAVIORAL HEALTH ROUNDING Patient sleeping: Yes.   Patient alert and oriented: not applicable SLEEPING Behavior appropriate: Yes.  ; If no, describe: SLEEPING Nutrition and fluids offered: No SLEEPING Toileting and hygiene offered: NoSLEEPING Sitter present: not applicable, Q 15 min safety rounds and observation. Law enforcement present: Yes ODS  ENVIRONMENTAL ASSESSMENT  Potentially harmful objects out of patient reach: Yes.  Personal belongings secured: Yes.  Patient dressed in hospital provided attire only: Yes.  Plastic bags out of patient reach: Yes.  Patient care equipment (cords, cables, call bells, lines, and drains) shortened, removed, or accounted for: Yes.  Equipment and supplies removed from bottom of stretcher: Yes.  Potentially toxic materials out of patient reach: Yes.  Sharps container removed or out of patient reach: Yes.   Pt sleeping at this time. respirations even and unlabored.

## 2016-02-07 NOTE — ED Notes (Signed)
Pt asleep, resps unlabored.  

## 2016-02-07 NOTE — ED Notes (Signed)
Report to ann, rn.  

## 2016-02-07 NOTE — ED Notes (Signed)
interpreter at bedside

## 2016-02-07 NOTE — ED Provider Notes (Signed)
[facility] Emergency Department Provider Note  ____________________________________________  Time seen: Approximately 6:00 PM  I have reviewed the triage vital signs and the nursing notes.   HISTORY  Chief Complaint Fall  Level 5 caveat:  Portions of the history and physical were unable to be obtained due to the patient's severe alcohol intoxication Encounter performed with Spanish interpreter at bedside, still unable to obtain useful history HPI Justin Schneider is a [age] y.o. male brought to the ED by EMS because he was found on the side of the road. Patient has no recollection. Denies drinking alcohol today. He seemed in this ED many times before for alcohol intoxication. Unable to remember his name or birthday.     History reviewed. No pertinent past medical history.   There are no active problems to display for this patient.    No past surgical history on file.   Prior to Admission medications   Not on File     Allergies Patient has no known allergies.   History reviewed. No pertinent family history.  Social History Social History  Substance Use Topics  . Smoking status: Not on file  . Smokeless tobacco: Not on file  . Alcohol use Not on file  No smoking. Daily alcohol intake  Review of Systems Unable to obtain due to intoxication and altered mental status  ____________________________________________   PHYSICAL EXAM:  VITAL SIGNS: ED Triage Vitals [02/07/16 1752]  Enc Vitals Group     BP (!) 145/113     Pulse Rate 90     Resp 18     Temp 97.5 F (36.4 C)     Temp Source Oral     SpO2 99 %     Weight 170 lb (77.1 kg)     Height 5\' 3"  (1.6 m)     Head Circumference      Peak Flow      Pain Score      Pain Loc      Pain Edu?      Excl. in GC?     Vital signs reviewed, nursing assessments reviewed.   Constitutional:  Awake. Not in distress. Eyes:   No scleral icterus. No conjunctival pallor. PERRL. EOMI.  No  nystagmus. ENT   Head:   Normocephalic with abrasion over the left maxilla. No bony tenderness or deformity..   Nose:   No congestion/rhinnorhea. No septal hematoma   Mouth/Throat:   MMM, no pharyngeal erythema. No peritonsillar mass.    Neck:   No stridor. No SubQ emphysema. No meningismus. Hematological/Lymphatic/Immunilogical:   No cervical lymphadenopathy. Cardiovascular:   RRR. Symmetric bilateral radial and DP pulses.  No murmurs.  Respiratory:   Normal respiratory effort without tachypnea nor retractions. Breath sounds are clear and equal bilaterally. No wheezes/rales/rhonchi. Gastrointestinal:   Soft and nontender. Non distended. There is no CVA tenderness.  No rebound, rigidity, or guarding. Genitourinary:   deferred Musculoskeletal:   Nontender with normal range of motion in all extremities. No joint effusions.  No lower extremity tenderness.  No edema. Neurologic:   Normal speech and language.  CN 2-10 normal. Motor grossly intact. No gross focal neurologic deficits are appreciated.  Skin:    Skin is warm, dry and intact. No rash noted.  No petechiae, purpura, or bullae.  ____________________________________________    LABS (pertinent positives/negatives) (all labs ordered are listed, but only abnormal results are displayed) Labs Reviewed  COMPREHENSIVE METABOLIC PANEL - Abnormal; Notable for the following:  Result Value   Sodium 133 (*)    Potassium 3.4 (*)    Chloride 97 (*)    Glucose, Bld 118 (*)    Calcium 8.6 (*)    Total Protein 8.2 (*)    AST 113 (*)    GFR calc non Af Amer 55 (*)    All other components within normal limits  ETHANOL - Abnormal; Notable for the following:    Alcohol, Ethyl (B) 481 (*)    All other components within normal limits  LIPASE, BLOOD - Abnormal; Notable for the following:    Lipase 141 (*)    All other components within normal limits  CBC WITH DIFFERENTIAL/PLATELET - Abnormal; Notable for the following:     Hemoglobin 11.8 (*)    HCT 36.2 (*)    MCV 71.1 (*)    MCH 23.2 (*)    RDW 18.8 (*)    Lymphs Abs 4.0 (*)    Eosinophils Absolute 1.0 (*)    All other components within normal limits   ____________________________________________   EKG    ____________________________________________    RADIOLOGY  CT head unremarkable CT cervical spine unremarkable CT maxillofacial unremarkable  ____________________________________________   PROCEDURES Procedures  ____________________________________________   INITIAL IMPRESSION / ASSESSMENT AND PLAN / ED COURSE  Pertinent labs & imaging results that were available during my care of the patient were reviewed by me and considered in my medical decision making (see chart for details).  Patient seen for alcohol intoxication and altered mental status. Trauma workup negative. Tdap given. Labs unremarkable except for alcohol level 481. We'll need to observe the patient in the ED for a prolonged period of time until he is clinically sober.  No acute medical needs at this time. No respiratory suppression, no hypoxia. Vital signs unremarkable.     Clinical Course    ____________________________________________   FINAL CLINICAL IMPRESSION(S) / ED DIAGNOSES  Final diagnoses:  Contusion of face, initial encounter  Alcoholic intoxication without complication (HCC)       Portions of this note were generated with dragon dictation software. Dictation errors may occur despite best attempts at proofreading.    Sharman CheekPhillip Sharnita Bogucki, MD 02/07/16 2141

## 2016-02-08 NOTE — ED Notes (Signed)
BEHAVIORAL HEALTH ROUNDING Patient sleeping: Yes.   Patient alert and oriented: not applicable SLEEPING Behavior appropriate: Yes.  ; If no, describe: SLEEPING Nutrition and fluids offered: No SLEEPING Toileting and hygiene offered: NoSLEEPING Sitter present: not applicable, Q 15 min safety rounds and observation. Law enforcement present: Yes ODS 

## 2016-02-08 NOTE — ED Provider Notes (Signed)
-----------------------------------------   4:03 AM on 02/08/2016 -----------------------------------------   Blood pressure 107/70, pulse 75, temperature 97.5 F (36.4 C), temperature source Oral, resp. rate 14, height 5\' 3"  (1.6 m), weight 170 lb (77.1 kg), SpO2 95 %.  Assuming care from Dr. Lenard LancePaduchowski.  In short, Bauer Nnn Vianne Bullsarada Fenton MallingRuiz is a [age] y.o. male with a chief complaint of Fall .  Refer to the original H&P for additional details.  The current plan of care is to reassess the patient and disposition him once he is clinically sober.   The patient is up and walking with a steady gait. He is ready to be discharged to home. He is clinically sober at this time his vital signs are unremarkable.   Rebecka ApleyAllison P Seraphim Trow, MD 02/08/16 [phone]

## 2016-02-08 NOTE — ED Notes (Addendum)
Pt up to the bathroom. Steady gait noted. Pt states he feels ok to go home but does not have anyone to pick him up. MD and Elon JesterMichele, RN Charge Nurse informed.

## 2016-02-09 ENCOUNTER — Encounter: Payer: Self-pay | Admitting: Emergency Medicine

## 2016-03-01 ENCOUNTER — Emergency Department
Admission: EM | Admit: 2016-03-01 | Discharge: 2016-03-01 | Disposition: A | Discharge status: Home / Self Care | Payer: Self-pay | Attending: Emergency Medicine | Admitting: Emergency Medicine

## 2016-03-01 ENCOUNTER — Encounter: Payer: Self-pay | Admitting: Emergency Medicine

## 2016-03-01 DIAGNOSIS — F10120 Alcohol abuse with intoxication, uncomplicated: Secondary | ICD-10-CM | POA: Insufficient documentation

## 2016-03-01 DIAGNOSIS — H11003 Unspecified pterygium of eye, bilateral: Secondary | ICD-10-CM | POA: Insufficient documentation

## 2016-03-01 DIAGNOSIS — F10920 Alcohol use, unspecified with intoxication, uncomplicated: Secondary | ICD-10-CM

## 2016-03-01 LAB — COMPREHENSIVE METABOLIC PANEL
ALBUMIN: 4.5 g/dL (ref 3.5–5.0)
ALK PHOS: 112 U/L (ref 38–126)
ALT: 77 U/L — AB (ref 17–63)
AST: 152 U/L — AB (ref 15–41)
Anion gap: 14 (ref 5–15)
BILIRUBIN TOTAL: 0.4 mg/dL (ref 0.3–1.2)
BUN: 10 mg/dL (ref 6–20)
CO2: 21 mmol/L — ABNORMAL LOW (ref 22–32)
CREATININE: 0.56 mg/dL — AB (ref 0.61–1.24)
Calcium: 8.9 mg/dL (ref 8.9–10.3)
Chloride: 99 mmol/L — ABNORMAL LOW (ref 101–111)
GFR calc Af Amer: >60 mL/min (ref >60)
GLUCOSE: 103 mg/dL — AB (ref 65–99)
POTASSIUM: 3.8 mmol/L (ref 3.5–5.1)
Sodium: 134 mmol/L — ABNORMAL LOW (ref 135–145)
TOTAL PROTEIN: 9 g/dL — AB (ref 6.5–8.1)

## 2016-03-01 LAB — CBC
HEMATOCRIT: 37.7 % — AB (ref 40.0–52.0)
Hemoglobin: 12.1 g/dL — ABNORMAL LOW (ref 13.0–18.0)
MCH: 22.6 pg — ABNORMAL LOW (ref 26.0–34.0)
MCHC: 32.2 g/dL (ref 32.0–36.0)
MCV: 70.2 fL — ABNORMAL LOW (ref 80.0–100.0)
PLATELETS: 255 10*3/uL (ref 150–440)
RBC: 5.37 MIL/uL (ref 4.40–5.90)
RDW: 19.2 % — AB (ref 11.5–14.5)
WBC: 5.9 10*3/uL (ref 3.8–10.6)

## 2016-03-01 LAB — ETHANOL: Alcohol, Ethyl (B): 434 mg/dL (ref <5)

## 2016-03-01 NOTE — ED Notes (Signed)
Pt ambulated around nursing station with no problems, spanish interpreter used for discharge

## 2016-03-01 NOTE — ED Triage Notes (Signed)
Per acems: pt. Found in someones yard unresponsive. Ambulatory and alert on scene, vs stable en route

## 2016-03-01 NOTE — ED Notes (Signed)
Pt resting in bed, resp even and unlabored 

## 2016-03-01 NOTE — ED Notes (Addendum)
Pt awake, given coke to drink and sandwich tray, EDP made aware

## 2016-03-01 NOTE — ED Notes (Signed)
Pt resting in bed, blanket covering face, resp even and unlabored

## 2016-03-01 NOTE — ED Provider Notes (Signed)
[facility] Emergency Department Provider Note  Time seen: 1:21 AM  I have reviewed the triage vital signs and the nursing notes.   HISTORY  Chief Complaint Altered Mental Status    HPI Justin Schneider is a 50 y.o. male with a past medical history of alcohol abuse presents to the emergency department by EMS, found down.According to EMS report a bystander called EMS of the sole the patient was lying in the yard. EMS states upon arrival patient awoke, very slurred speech, admits alcohol use tonight. Patient has no medical complaints at this time. Slurred speech in the emergency department is able to answer questions and follow basic commands.  Past Medical History:  Diagnosis Date  . Alcoholism /alcohol abuse (HCC)   . Unknown and unspecified causes of morbidity     There are no active problems to display for this patient.   Past Surgical History:  Procedure Laterality Date  . unknown      Prior to Admission medications   Not on File    No Known Allergies  No family history on file.  Social History Social History  Substance Use Topics  . Smoking status: Never Smoker  . Smokeless tobacco: Never Used  . Alcohol use Yes    Review of Systems Constitutional: Negative for fever.  Admits alcohol intoxication. Cardiovascular: Negative for chest pain. Respiratory: Negative for shortness of breath. Gastrointestinal: Negative for abdominal pain Neurological: Negative for headache 10-point ROS otherwise negative.  ____________________________________________   PHYSICAL EXAM:  VITAL SIGNS: ED Triage Vitals  Enc Vitals Group     BP 03/01/16 0057 (!) 143/103     Pulse Rate 03/01/16 0057 89     Resp 03/01/16 0057 12     Temp 03/01/16 0057 97.8 F (36.6 C)     Temp Source 03/01/16 0057 Oral     SpO2 03/01/16 0057 97 %     Weight 03/01/16 0059 170 lb (77.1 kg)     Height 03/01/16 0059 5\' 3"  (1.6 m)     Head Circumference --      Peak Flow  --      Pain Score --      Pain Loc --      Pain Edu? --      Excl. in GC? --     Constitutional: Alert. Slurred speech. Eyes: Bilateral pterygium ENT   Head: Normocephalic and atraumatic.   Mouth/Throat: Mucous membranes are moist. Cardiovascular: Normal rate, regular rhythm. No murmur Respiratory: Normal respiratory effort without tachypnea nor retractions. Breath sounds are clear Gastrointestinal: Soft and nontender. No distention.  Musculoskeletal: Nontender with normal range of motion in all extremities. No signs of trauma. Neurologic:  Slurred speech. No gross focal neurologic deficits Skin:  Skin is warm, dry and intact.  Psychiatric: Mood and affect are normal.   ____________________________________________   INITIAL IMPRESSION / ASSESSMENT AND PLAN / ED COURSE  Pertinent labs & imaging results that were available during my care of the patient were reviewed by me and considered in my medical decision making (see chart for details).  Patient presents emergency Department with likely alcohol intoxication. Patient found in the front yard, not his house. Admits alcohol use. Slurred speech. No signs of trauma on exam. Good range of motion in all extremities. We will check labs and closely monitor in the emergency department.  Significantly elevated alcohol level 434. We will keep the patient in the emergency department until clinically sober or until he is able to  arrange a ride to pick him up.  ____________________________________________   FINAL CLINICAL IMPRESSION(S) / ED DIAGNOSES  Alcohol intoxication    Minna AntisKevin Baylyn Sickles, MD 03/01/16 [phone]

## 2016-03-01 NOTE — ED Notes (Signed)
Pt eyes open, awake, resting in bed

## 2016-03-01 NOTE — ED Provider Notes (Addendum)
Patient is now awake alert oriented 3 appears clinically sober using a translator I warned him not to do this again as he could freeze to death and winter. He indicates his understanding.   Justin NatalPaul F Keeli Roberg, MD 03/01/16 1123    Justin NatalPaul F Darothy Courtright, MD 03/01/16 [phone]

## 2016-03-01 NOTE — ED Notes (Signed)
Pt resting in bed, resp even and unlabored, eyes closed 

## 2016-04-29 ENCOUNTER — Emergency Department
Admission: EM | Admit: 2016-04-29 | Discharge: 2016-04-30 | Disposition: A | Discharge status: Home / Self Care | Payer: Self-pay | Attending: Emergency Medicine | Admitting: Emergency Medicine

## 2016-04-29 DIAGNOSIS — F10120 Alcohol abuse with intoxication, uncomplicated: Secondary | ICD-10-CM | POA: Insufficient documentation

## 2016-04-29 DIAGNOSIS — F1010 Alcohol abuse, uncomplicated: Secondary | ICD-10-CM

## 2016-04-29 DIAGNOSIS — F10920 Alcohol use, unspecified with intoxication, uncomplicated: Secondary | ICD-10-CM

## 2016-04-29 LAB — ETHANOL: ALCOHOL ETHYL (B): 526 mg/dL — AB (ref <5)

## 2016-04-29 LAB — CBC
HCT: 35.1 % — ABNORMAL LOW (ref 40.0–52.0)
Hemoglobin: 11 g/dL — ABNORMAL LOW (ref 13.0–18.0)
MCH: 22.3 pg — AB (ref 26.0–34.0)
MCHC: 31.3 g/dL — ABNORMAL LOW (ref 32.0–36.0)
MCV: 71.5 fL — ABNORMAL LOW (ref 80.0–100.0)
PLATELETS: 257 10*3/uL (ref 150–440)
RBC: 4.91 MIL/uL (ref 4.40–5.90)
RDW: 19.3 % — AB (ref 11.5–14.5)
WBC: 7.1 10*3/uL (ref 3.8–10.6)

## 2016-04-29 LAB — BASIC METABOLIC PANEL
ANION GAP: 10 (ref 5–15)
BUN: 8 mg/dL (ref 6–20)
CALCIUM: 8.9 mg/dL (ref 8.9–10.3)
CO2: 28 mmol/L (ref 22–32)
Chloride: 101 mmol/L (ref 101–111)
Creatinine, Ser: 0.85 mg/dL (ref 0.61–1.24)
GFR calc Af Amer: >60 mL/min (ref >60)
Glucose, Bld: 113 mg/dL — ABNORMAL HIGH (ref 65–99)
POTASSIUM: 3.7 mmol/L (ref 3.5–5.1)
SODIUM: 139 mmol/L (ref 135–145)

## 2016-04-29 MED ORDER — ONDANSETRON HCL 4 MG/2ML IJ SOLN
4.0000 mg | Freq: Once | INTRAMUSCULAR | Status: AC
  Administered 2016-04-29: 4 mg via INTRAVENOUS
  Filled 2016-04-29: qty 2

## 2016-04-29 MED ORDER — SODIUM CHLORIDE 0.9 % IV BOLUS (SEPSIS)
1000.0000 mL | Freq: Once | INTRAVENOUS | Status: AC
  Administered 2016-04-29: 1000 mL via INTRAVENOUS

## 2016-04-29 NOTE — ED Provider Notes (Signed)
[facility] Emergency Department Provider Note   ____________________________________________   None    (approximate)  I have reviewed the triage vital signs and the nursing notes.  Spanish interpreter utilize throughout  HISTORY  Chief Complaint Alcohol Intoxication  EM caveat: Patient altered mental status, somnolence limit history  HPI Justin Schneider is a 51 y.o. male history of previous ED visits, and alcoholism  Today's clinical history is somewhat unclear, but EMS reports 911 was called for him as the patient was found laying on the ground. There is concern for possible alcohol intoxication  The patient himself answer simple questions, tends to stare forward, but will follow and track examiner. He reports that he was using "alcohol". He cannot Quantify how much  Denies any injury.  Denies any desire to harm self or others  Past Medical History:  Diagnosis Date  . Alcoholism /alcohol abuse (HCC)   . Unknown and unspecified causes of morbidity     There are no active problems to display for this patient.   Past Surgical History:  Procedure Laterality Date  . unknown      Prior to Admission medications   Not on File    Allergies Patient has no known allergies.  No family history on file.  Social History Social History  Substance Use Topics  . Smoking status: Never Smoker  . Smokeless tobacco: Never Used  . Alcohol use Yes    Review of Systems Denies pain No nausea No headache No neck pain No chest pain No shortness of breath ____________________________________________   PHYSICAL EXAM:  VITAL SIGNS: ED Triage Vitals [04/29/16 1847]  Enc Vitals Group     BP 132/67     Pulse Rate 80     Resp 16     Temp 98.7 F (37.1 C)     Temp Source Axillary     SpO2 100 %     Weight 170 lb (77.1 kg)     Height      Head Circumference      Peak Flow      Pain Score      Pain Loc      Pain Edu?      Excl. in GC?      Constitutional: Alert and oriented. Somewhat somnolent, but sits up tracks examiner and follows commands. Eyes: Conjunctivae are slightly injected, and the eyes appear to have chronic changes and scarring overlying the iris is bilaterally. Extraocular movements intact, though lateral nystagmus is notable when tracking the fingers. Head: Atraumatic. Nose: No congestion/rhinnorhea. Mouth/Throat: Mucous membranes are moist.  Oropharynx non-erythematous. Neck: No stridor.   Cardiovascular: Normal rate, regular rhythm. Grossly normal heart sounds.  Good peripheral circulation. Respiratory: Normal respiratory effort.  No retractions. Lungs CTAB. Gastrointestinal: Soft and nontender. No distention.  Musculoskeletal: No lower extremity tenderness nor edema.  No joint effusions. Neurologic: Moves all extremities without gross deficit. No cranial nerve deficits. The patient does seem to have slurring of his speech Skin:  Skin is warm, dry and intact. No rash noted. Psychiatric: Mood and affect are normal. Patient somnolent  ____________________________________________   LABS (all labs ordered are listed, but only abnormal results are displayed)  Labs Reviewed  CBC - Abnormal; Notable for the following:       Result Value   Hemoglobin 11.0 (*)    HCT 35.1 (*)    MCV 71.5 (*)    MCH 22.3 (*)    MCHC 31.3 (*)  RDW 19.3 (*)    All other components within normal limits  BASIC METABOLIC PANEL - Abnormal; Notable for the following:    Glucose, Bld 113 (*)    All other components within normal limits  ETHANOL - Abnormal; Notable for the following:    Alcohol, Ethyl (B) 526 (*)    All other components within normal limits   ____________________________________________  EKG   ____________________________________________  RADIOLOGY   ____________________________________________   PROCEDURES  Procedure(s) performed: None  Procedures  Critical Care performed:  No  ____________________________________________   INITIAL IMPRESSION / ASSESSMENT AND PLAN / ED COURSE  Pertinent labs & imaging results that were available during my care of the patient were reviewed by me and considered in my medical decision making (see chart for details).  Patient presents for altered mental status. He is somnolent, but follows commands. He cannot provide a full clinical history, but clinical examination with slight slurring of speech, lateral gaze nystagmus, and somnolent seems to suggest acute intoxication. He denies any acute psychiatric symptoms, and is calm and pleasant at this time  We will check labs, including an ethanol level. Ethanol is elevated I suspect clinically this is likely the cause for today's presentation. No evidence of trauma by examination, no history of trauma by paramedics either.    Clinical Course as of Apr 29 2298  Fri Apr 29, 2016  2006 Critical alcohol level. Appears consistent with intoxication, and at this point we'll hydrate and observe the patient. If the patient shows improvement, clinical signs of intoxication resolved, and he is able to secure a ride home he could be safely discharged once clinically sober  [MQ]    Clinical Course User Index [MQ] Sharyn Creamer, MD    ----------------------------------------- 11:01 PM on 04/29/2016 -----------------------------------------  Patient resting comfortably in the hallway. Ongoing care assigned to Dr. Don Perking. Discharged once clinically sober into the care of a responsible other adult. ____________________________________________   FINAL CLINICAL IMPRESSION(S) / ED DIAGNOSES  Final diagnoses:  Alcoholic intoxication without complication (HCC)      NEW MEDICATIONS STARTED DURING THIS VISIT:  New Prescriptions   No medications on file     Note:  This document was prepared using Dragon voice recognition software and may include unintentional dictation errors.     Sharyn Creamer, MD 04/29/16 [phone]

## 2016-04-29 NOTE — ED Notes (Signed)
Pt's orange sweater and blue jacket in plastic belongings bag pt preferred bag to be at his left side

## 2016-04-29 NOTE — Discharge Instructions (Addendum)
You have been seen in the Emergency Department (ED) today for substance abuse.    Please return to the ED immediately if you have ANY thoughts of hurting yourself or anyone else, so that we may help you.  Please avoid alcohol and drug use.  Follow up with your doctor and/or therapist as soon as possible regarding today's ED  visit.   Please follow up any other recommendations and clinic appointments provided by the psychiatry team that saw you in the Emergency Department. 

## 2016-04-29 NOTE — ED Notes (Signed)
MD Quale made aware of pt's ETOH.

## 2016-04-29 NOTE — ED Notes (Signed)
Interpreter requested 

## 2016-04-29 NOTE — ED Notes (Signed)
Pt responds yes/no questions verbalizes name DOB. Denies any allergies denies any medical problems, denies any medications

## 2016-04-29 NOTE — ED Triage Notes (Addendum)
Pt came to ED via EMS. Bystander saw pt slumped over on corner. EMS arrived, strong smell of alcohol. Given intranasal narcan with minimal response. CBG 125. VS stable.

## 2016-04-30 NOTE — ED Notes (Signed)
Patient ambulatory, gait steady, breakfast taken, alert and requests discharge.

## 2016-04-30 NOTE — ED Provider Notes (Signed)
-----------------------------------------   8:10 AM on 04/30/2016 -----------------------------------------  Patient is now awake, he is ambulated to the bathroom without difficulty, currently eating breakfast. Patient is asking to be discharged home. We'll discharge the patient at this time, clinically he appears well.   Minna AntisKevin Makaiyah Schweiger, MD 04/30/16 [phone]

## 2016-07-15 IMAGING — CR DG LUMBAR SPINE 2-3V
1 series · 3 of 3 positions shown · non-contrast
Comparison: None.

CLINICAL DATA: Found on ground near a store

EXAM:
LUMBAR SPINE - 2-3 VIEW

[Series 1: dxr lumbar spine ap and lateral · 0.14mm/px · 3 of 3 slices shown]
[im 1/3]
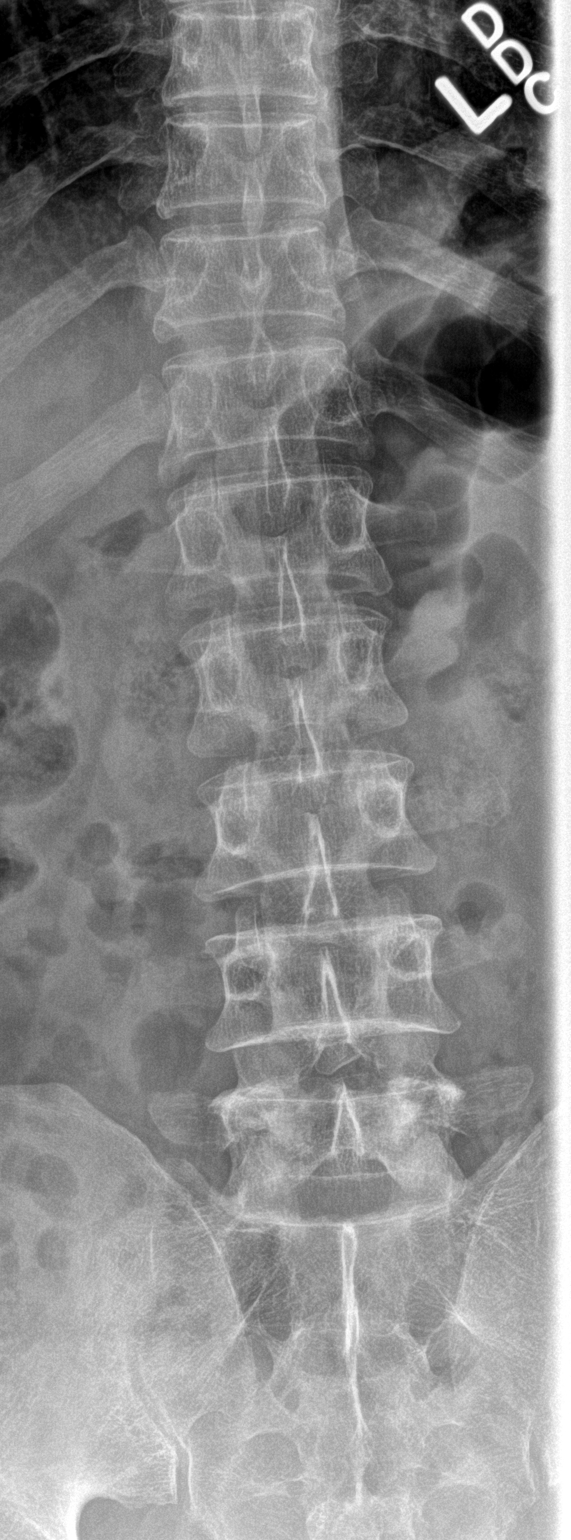
[im 2/3]
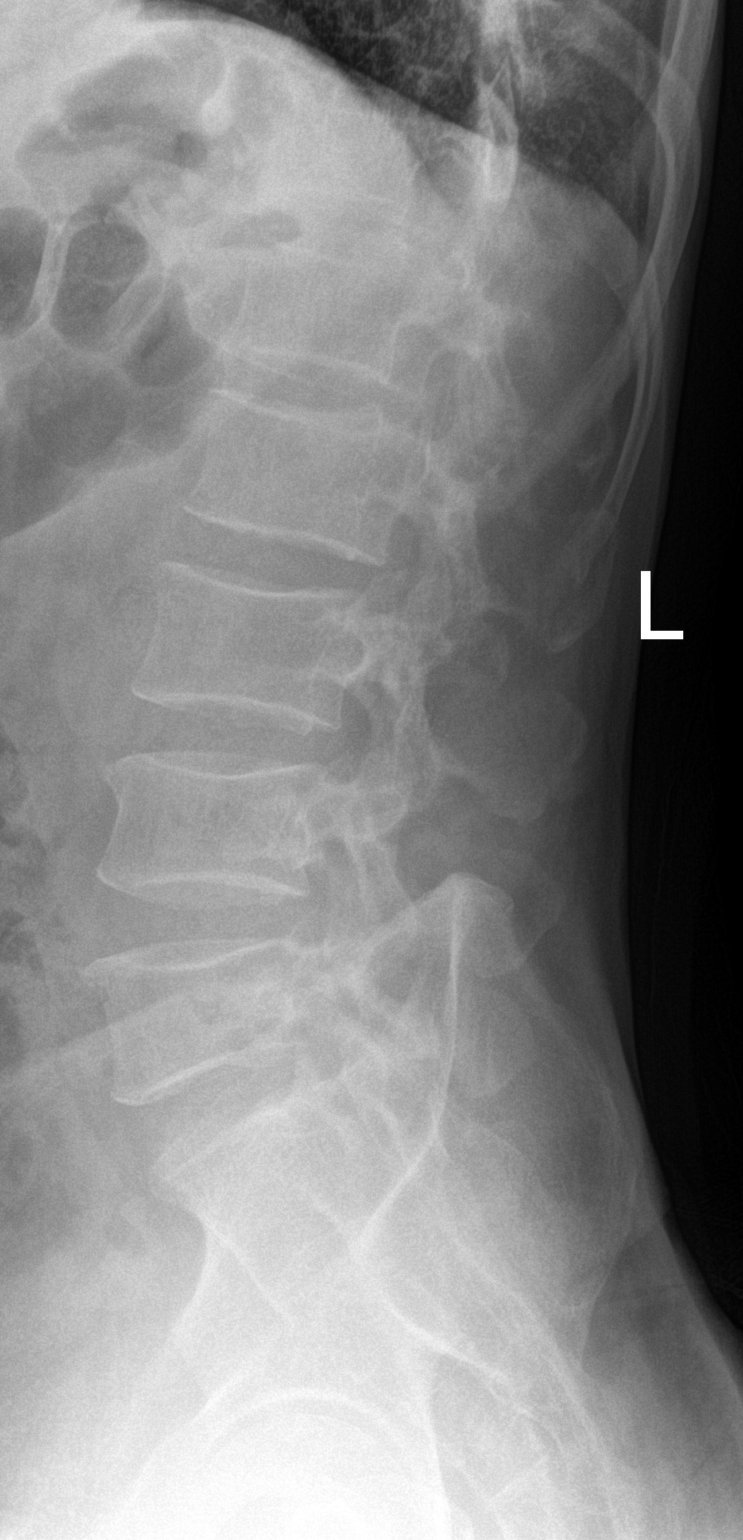
[im 3/3]
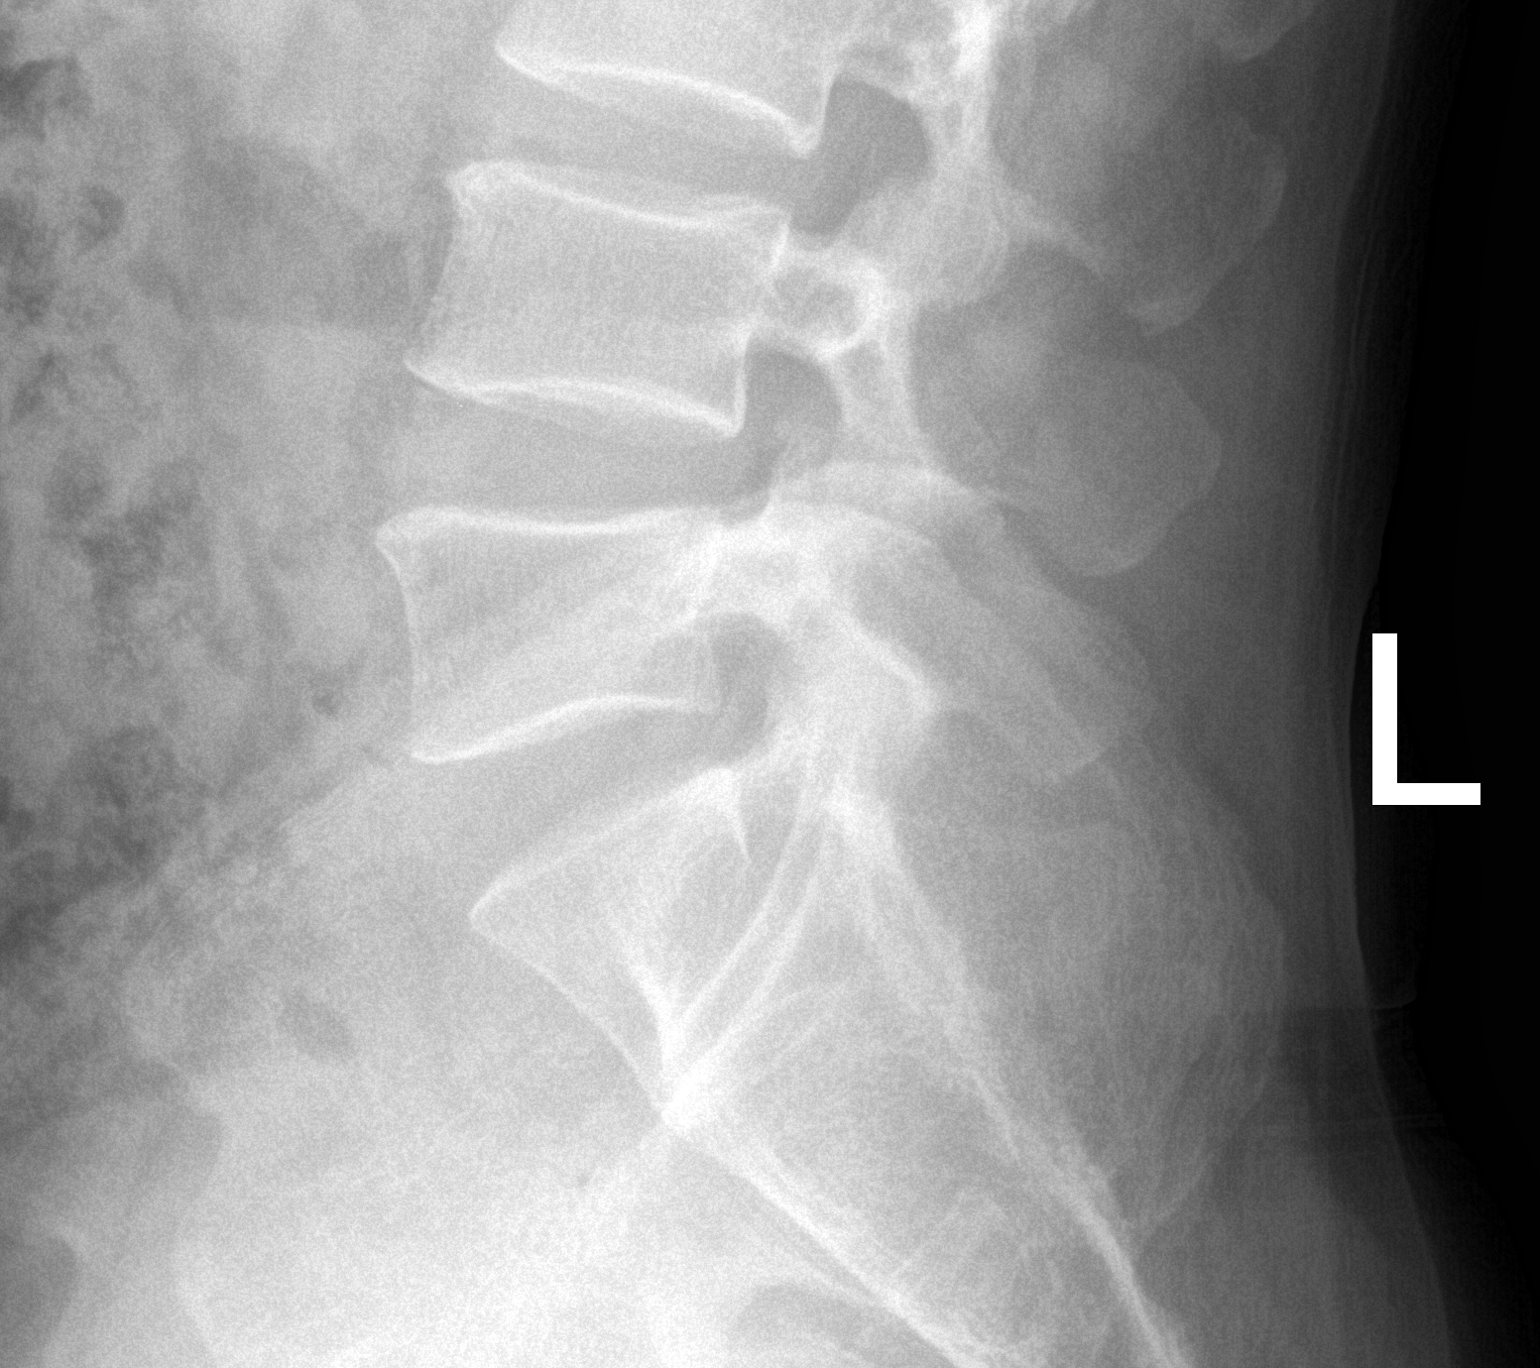

[3 of 3 positions shown; findings below may reference images not displayed]

FINDINGS: There is no evidence of lumbar spine fracture. Alignment is normal.
Intervertebral disc spaces are maintained.
IMPRESSION: Negative.

## 2016-08-20 ENCOUNTER — Emergency Department
Admission: EM | Admit: 2016-08-20 | Discharge: 2016-08-21 | Disposition: A | Discharge status: Home / Self Care | Payer: Self-pay | Attending: Emergency Medicine | Admitting: Emergency Medicine

## 2016-08-20 ENCOUNTER — Emergency Department
Admission: EM | Admit: 2016-08-20 | Discharge: 2016-08-20 | Disposition: A | Discharge status: Home / Self Care | Payer: Self-pay | Attending: Emergency Medicine | Admitting: Emergency Medicine

## 2016-08-20 ENCOUNTER — Emergency Department: Payer: Self-pay

## 2016-08-20 DIAGNOSIS — Y999 Unspecified external cause status: Secondary | ICD-10-CM | POA: Insufficient documentation

## 2016-08-20 DIAGNOSIS — Y929 Unspecified place or not applicable: Secondary | ICD-10-CM | POA: Insufficient documentation

## 2016-08-20 DIAGNOSIS — K7010 Alcoholic hepatitis without ascites: Secondary | ICD-10-CM | POA: Insufficient documentation

## 2016-08-20 DIAGNOSIS — E860 Dehydration: Secondary | ICD-10-CM | POA: Insufficient documentation

## 2016-08-20 DIAGNOSIS — S92345A Nondisplaced fracture of fourth metatarsal bone, left foot, initial encounter for closed fracture: Secondary | ICD-10-CM | POA: Insufficient documentation

## 2016-08-20 DIAGNOSIS — W208XXA Other cause of strike by thrown, projected or falling object, initial encounter: Secondary | ICD-10-CM | POA: Insufficient documentation

## 2016-08-20 DIAGNOSIS — Y939 Activity, unspecified: Secondary | ICD-10-CM | POA: Insufficient documentation

## 2016-08-20 DIAGNOSIS — S92325A Nondisplaced fracture of second metatarsal bone, left foot, initial encounter for closed fracture: Secondary | ICD-10-CM | POA: Insufficient documentation

## 2016-08-20 DIAGNOSIS — F10920 Alcohol use, unspecified with intoxication, uncomplicated: Secondary | ICD-10-CM | POA: Insufficient documentation

## 2016-08-20 DIAGNOSIS — S92335A Nondisplaced fracture of third metatarsal bone, left foot, initial encounter for closed fracture: Secondary | ICD-10-CM | POA: Insufficient documentation

## 2016-08-20 DIAGNOSIS — R4182 Altered mental status, unspecified: Secondary | ICD-10-CM | POA: Insufficient documentation

## 2016-08-20 DIAGNOSIS — F10120 Alcohol abuse with intoxication, uncomplicated: Secondary | ICD-10-CM | POA: Insufficient documentation

## 2016-08-20 LAB — COMPREHENSIVE METABOLIC PANEL
ALBUMIN: 4.3 g/dL (ref 3.5–5.0)
ALT: 59 U/L (ref 17–63)
ANION GAP: 13 (ref 5–15)
AST: 163 U/L — AB (ref 15–41)
Alkaline Phosphatase: 136 U/L — ABNORMAL HIGH (ref 38–126)
BILIRUBIN TOTAL: 0.7 mg/dL (ref 0.3–1.2)
BUN: 7 mg/dL (ref 6–20)
CO2: 22 mmol/L (ref 22–32)
Calcium: 8.8 mg/dL — ABNORMAL LOW (ref 8.9–10.3)
Chloride: 98 mmol/L — ABNORMAL LOW (ref 101–111)
Creatinine, Ser: 0.57 mg/dL — ABNORMAL LOW (ref 0.61–1.24)
GFR calc Af Amer: >60 mL/min (ref >60)
GFR calc non Af Amer: >60 mL/min (ref >60)
GLUCOSE: 111 mg/dL — AB (ref 65–99)
POTASSIUM: 3.7 mmol/L (ref 3.5–5.1)
SODIUM: 133 mmol/L — AB (ref 135–145)
TOTAL PROTEIN: 8.6 g/dL — AB (ref 6.5–8.1)

## 2016-08-20 LAB — URINALYSIS, COMPLETE (UACMP) WITH MICROSCOPIC
BACTERIA UA: NONE SEEN
BILIRUBIN URINE: NEGATIVE
GLUCOSE, UA: NEGATIVE mg/dL
Hgb urine dipstick: NEGATIVE
KETONES UR: NEGATIVE mg/dL
LEUKOCYTES UA: NEGATIVE
NITRITE: NEGATIVE
PH: 5 (ref 5.0–8.0)
PROTEIN: NEGATIVE mg/dL
RBC / HPF: NONE SEEN RBC/hpf (ref 0–5)
SQUAMOUS EPITHELIAL / LPF: NONE SEEN
Specific Gravity, Urine: 1.003 — ABNORMAL LOW (ref 1.005–1.030)
WBC, UA: NONE SEEN WBC/hpf (ref 0–5)

## 2016-08-20 LAB — URINE DRUG SCREEN, QUALITATIVE (ARMC ONLY)
Amphetamines, Ur Screen: NOT DETECTED
BARBITURATES, UR SCREEN: NOT DETECTED
BENZODIAZEPINE, UR SCRN: NOT DETECTED
Cannabinoid 50 Ng, Ur ~~LOC~~: NOT DETECTED
Cocaine Metabolite,Ur ~~LOC~~: NOT DETECTED
MDMA (Ecstasy)Ur Screen: NOT DETECTED
METHADONE SCREEN, URINE: NOT DETECTED
OPIATE, UR SCREEN: NOT DETECTED
PHENCYCLIDINE (PCP) UR S: NOT DETECTED
Tricyclic, Ur Screen: NOT DETECTED

## 2016-08-20 LAB — CBC WITH DIFFERENTIAL/PLATELET
BASOS ABS: 0.1 10*3/uL (ref 0–0.1)
BASOS PCT: 1 %
EOS ABS: 0.9 10*3/uL — AB (ref 0–0.7)
EOS PCT: 13 %
HEMATOCRIT: 33.3 % — AB (ref 40.0–52.0)
Hemoglobin: 10.7 g/dL — ABNORMAL LOW (ref 13.0–18.0)
Lymphocytes Relative: 32 %
Lymphs Abs: 2.2 10*3/uL (ref 1.0–3.6)
MCH: 22.4 pg — ABNORMAL LOW (ref 26.0–34.0)
MCHC: 32.2 g/dL (ref 32.0–36.0)
MCV: 69.5 fL — ABNORMAL LOW (ref 80.0–100.0)
MONO ABS: 0.6 10*3/uL (ref 0.2–1.0)
MONOS PCT: 8 %
NEUTROS ABS: 3.1 10*3/uL (ref 1.4–6.5)
Neutrophils Relative %: 46 %
PLATELETS: 139 10*3/uL — AB (ref 150–440)
RBC: 4.79 MIL/uL (ref 4.40–5.90)
RDW: 20 % — AB (ref 11.5–14.5)
WBC: 6.7 10*3/uL (ref 3.8–10.6)

## 2016-08-20 LAB — ETHANOL: Alcohol, Ethyl (B): 520 mg/dL (ref <5)

## 2016-08-20 MED ORDER — VITAMIN B-1 100 MG PO TABS
100.0000 mg | ORAL_TABLET | Freq: Every day | ORAL | Status: DC
  Administered 2016-08-20: 100 mg via ORAL
  Filled 2016-08-20: qty 1

## 2016-08-20 MED ORDER — THIAMINE HCL 100 MG/ML IJ SOLN: 100.0000 mg | Freq: Every day | INTRAMUSCULAR | Status: DC

## 2016-08-20 MED ORDER — SODIUM CHLORIDE 0.9 % IV BOLUS (SEPSIS): 1000.0000 mL | Freq: Once | INTRAVENOUS | Status: DC

## 2016-08-20 NOTE — ED Provider Notes (Signed)
[facility] Emergency Department Provider Note  ____________________________________________  Time seen: Approximately 1:14 PM  I have reviewed the triage vital signs and the nursing notes.   HISTORY  Chief Complaint No chief complaint on file. Level 5 caveat:  Portions of the history and physical were unable to be obtained due to the patient's altered mental status Encounter completed with Spanish interpreter at bedside  HPI Justin Schneider is a 51 y.o. male brought to the ED by police due to being found intoxicated and confused to the point of not remembering his own name or birthday. They found him lying in a gutter on the street. A friend with the patient did report that he normally walks to the store and drinks to the point of blackout in nearby woods, and then when he wakes up he goes and drink some more. This is his daily habit. Today he was having trouble walking and his friend had to help him but the patient was falling down.  Patient denies pain or other complaints. When asked if he has any medical problems, the patient responds "maybe"  ----------------------------------------- 1:24 PM on 08/20/2016 -----------------------------------------   OBSERVATION CARE: This patient is being placed under observation care for the following reasons: Intoxicated head injury patient observed to r/o significant injury         Past Medical History:  Diagnosis Date  . Alcoholism /alcohol abuse (HCC)   . Unknown and unspecified causes of morbidity      There are no active problems to display for this patient.    Past Surgical History:  Procedure Laterality Date  . unknown    Unable to obtain   Prior to Admission medications   Not on File  None   Allergies Patient has no known allergies.   No family history on file. none Social History Social History  Substance Use Topics  . Smoking status: Never Smoker  . Smokeless tobacco: Never  Used  . Alcohol use Yes    Review of Systems Unable to reliably obtained due to altered mental status ____________________________________________   PHYSICAL EXAM:  VITAL SIGNS: ED Triage Vitals  Enc Vitals Group     BP      Pulse      Resp      Temp      Temp src      SpO2      Weight      Height      Head Circumference      Peak Flow      Pain Score      Pain Loc      Pain Edu?      Excl. in GC?     Vital signs reviewed, nursing assessments reviewed.   Constitutional:  Awake, not oriented to person place or time. Not in distress. Smells of alcohol Eyes:   No scleral icterus. No conjunctival pallor. PERRL. EOMI.  No nystagmus. ENT   Head:   Normocephalic and atraumaticExcept for small abrasion over the right maxilla.   Nose:   No congestion/rhinnorhea. No epistaxis   Mouth/Throat:   MMM, no pharyngeal erythema. No peritonsillar mass.    Neck:   No meningismus. Full ROM Hematological/Lymphatic/Immunilogical:   No cervical lymphadenopathy. Cardiovascular:   RRR. Symmetric bilateral radial and DP pulses.  No murmurs.  Respiratory:   Normal respiratory effort without tachypnea/retractions. Breath sounds are clear and equal bilaterally. No wheezes/rales/rhonchi. Gastrointestinal:   Soft and nontender. Non distended. There is no CVA  tenderness.  No rebound, rigidity, or guarding. Genitourinary:   deferred Musculoskeletal:   Normal range of motion in all extremities. No joint effusions.  Diffuse swelling of the left midfoot with tenderness. Intact range of motion of the toes and ankle. Neurologic:   Slurred speech, confused  Motor grossly intact. No gross focal neurologic deficits are appreciated.  Skin:    Skin is warm, dry and intact. No rash noted.  No petechiae, purpura, or bullae.  ____________________________________________    LABS (pertinent positives/negatives) (all labs ordered are listed, but only abnormal results are displayed) Labs Reviewed   CBC WITH DIFFERENTIAL/PLATELET - Abnormal; Notable for the following:       Result Value   Hemoglobin 10.7 (*)    HCT 33.3 (*)    MCV 69.5 (*)    MCH 22.4 (*)    RDW 20.0 (*)    Platelets 139 (*)    Eosinophils Absolute 0.9 (*)    All other components within normal limits  COMPREHENSIVE METABOLIC PANEL - Abnormal; Notable for the following:    Sodium 133 (*)    Chloride 98 (*)    Glucose, Bld 111 (*)    Creatinine, Ser 0.57 (*)    Calcium 8.8 (*)    Total Protein 8.6 (*)    AST 163 (*)    Alkaline Phosphatase 136 (*)    All other components within normal limits  ETHANOL - Abnormal; Notable for the following:    Alcohol, Ethyl (B) 520 (*)    All other components within normal limits  URINALYSIS, COMPLETE (UACMP) WITH MICROSCOPIC - Abnormal; Notable for the following:    Color, Urine STRAW (*)    APPearance CLEAR (*)    Specific Gravity, Urine 1.003 (*)    All other components within normal limits  URINE DRUG SCREEN, QUALITATIVE ([facility] ONLY)   ____________________________________________   EKG    ____________________________________________    RADIOLOGY  No results found. X-ray performed under an anonymous trauma record due to initially being unable to identify the patient.  CLINICAL DATA:  Swelling, pain, redness, recent fall  EXAM: LEFT FOOT - COMPLETE 3+ VIEW  COMPARISON:  None available  FINDINGS: Diffuse soft tissue swelling of the midfoot. Acute / subacute nondisplaced fractures of the left second, third, and fourth metatarsal bases. Small amount of surrounding callus formation suspected at the third and fourth metatarsal fractures. Tarsal bones appear intact. No joint abnormality. Calcaneus appears intact.  IMPRESSION: Acute / subacute nondisplaced fractures of the left second, third and fourth metatarsal bases.   Electronically Signed   By: Judie PetitM.  Shick M.D.   On: 08/20/2016  12:56 ____________________________________________   PROCEDURES Procedures  ____________________________________________   INITIAL IMPRESSION / ASSESSMENT AND PLAN / ED COURSE  Pertinent labs & imaging results that were available during my care of the patient were reviewed by me and considered in my medical decision making (see chart for details).  Patient presents with alcohol intoxication. Also left foot swelling due to what he reports is dropping something on his foot about a week ago. X-ray does show metatarsal fractures. Due to the patient's alcoholism, he is a fall risk and unreliable to participate in safe splint care were crutch use so we will put a postop shoe on him.   patient was IVC by police. We'll keep him in the ED until he is sober for reassessment. IV fluids and labs for now. Exam does not reveal any concerns for significant head or spinal injury. We'll observe in the  ED for improvement until clinically sober for reassessment to ensure he doesn't require any neuro imaging.  ----------------------------------------- 1:20 PM on 08/20/2016 -----------------------------------------   OBSERVATION CARE: This patient is being placed under observation care for the following reasons: Intoxicated head injury patient observed to r/o significant injury     Clinical Course as of Aug 20 1533  Sat Aug 20, 2016  1433 Pt improving. More conversant. Still intoxicated. Denies complaitns except foot pain.    [PS]  1534 END OF OBSERVATION STATUS: After an appropriate period of observation, this patient is being discharged due to the following reason(s):  Clinical improvement.  Patient is not currently sober enough for discharge, but with his improving mental status, it is now anticipated that he'll be medically stable and suitable for discharge once sober.    [PS]    Clinical Course User Index [PS] Sharman Cheek, MD     ____________________________________________   FINAL CLINICAL IMPRESSION(S) / ED DIAGNOSES  Final diagnoses:  Alcoholic intoxication without complication (HCC)  Dehydration  Alcoholic hepatitis without ascites  Alcohol intoxication  Dehydration   New Prescriptions   No medications on file     Portions of this note were generated with dragon dictation software. Dictation errors may occur despite best attempts at proofreading.    Sharman Cheek, MD 08/20/16 1536

## 2016-08-20 NOTE — ED Notes (Signed)
Pt ambulatory to restroom to void. Pt provided with water, declines offer for snack. No tremors, nausea, vomiting, diaphoresis, sweating noted. Pt denies pain.

## 2016-08-20 NOTE — ED Notes (Signed)
Attempted to make telephone contact with next of kin on file, no answer.

## 2016-08-20 NOTE — ED Notes (Signed)
Chart to be merged, see triage [facility].

## 2016-08-20 NOTE — ED Notes (Signed)
BEHAVIORAL HEALTH ROUNDING Patient sleeping: No. Patient alert and oriented: yes Behavior appropriate: Yes.  ; If no, describe:  Nutrition and fluids offered: Yes  Toileting and hygiene offered: Yes  Sitter present: not applicable Law enforcement present: Yes  

## 2016-08-20 NOTE — ED Notes (Signed)
BEHAVIORAL HEALTH ROUNDING Patient sleeping: Yes.   Patient alert and oriented: not applicable Behavior appropriate: Yes.  ; If no, describe:  Nutrition and fluids offered: No Toileting and hygiene offered: No Sitter present: not applicable Law enforcement present: Yes  

## 2016-08-20 NOTE — Discharge Instructions (Addendum)
Please return for any further problems. Please follow-up with [facility] primary care and orthopedics for foot fracture. Wear a stiff soled shoe wear the cast boot to protect your foot where it is broken. Do this until the orthopedic doctor Toshi not to Please return for any further problems.

## 2016-08-20 NOTE — ED Notes (Signed)
Pt states to call Justin Schneider, number for a Justin Schneider ruiz found in pt's record. Number called, message left to Justin Schneider to call emergency department.

## 2016-08-20 NOTE — ED Notes (Signed)
Eating dinner. Tolerated well.

## 2016-08-20 NOTE — ED Triage Notes (Signed)
Patient arrives EMS. Per report patient was found on side of road. Patient smells of ETOH and is awake and smiling now. Unable to get DOB through interpreter. Cannot state what happened. Complains of L foot pain with exam and is noted swollen. No resp distress. JPB elevated.

## 2016-08-20 NOTE — ED Notes (Signed)
Ambulates to BR with slight unsteady gait, tech at side.

## 2016-08-20 NOTE — ED Provider Notes (Signed)
I accepted care from Dr. Scotty CourtStafford.  During my shift patient has been able towards the bathroom. He has interacted with the nurse [place] in [place]. I don't see any signs that he has indication for head CT. Mental status is improving.  Patient states he does not have a ride home.  I'd prefer to discharge him to care of sober adult.  I'm a little concerned about keeping him here overnight in terms of running into issues with withdrawal.  If right cannot be arranged with patient, patient care will transferred to Dr. Darnelle CatalanMalinda at shift change 11 PM.   Governor RooksLord, Seneca Gadbois, MD 08/20/16 2204

## 2016-08-20 NOTE — ED Notes (Signed)
Pt sleeping, no diaphoresis or tremors noted. resps unlabored.

## 2016-08-20 NOTE — ED Notes (Signed)
Post op shoe to L foot

## 2016-08-20 NOTE — ED Notes (Signed)
Report from kim, rn.  

## 2016-08-20 NOTE — ED Notes (Signed)

## 2016-08-20 NOTE — ED Notes (Signed)
Woke pt to show him dinner tray. Pt chose to go back to sleep at this time. Dinner tray located within reach of pt.

## 2016-08-21 MED ORDER — CHLORDIAZEPOXIDE HCL 25 MG PO CAPS
50.0000 mg | ORAL_CAPSULE | Freq: Once | ORAL | Status: AC
  Administered 2016-08-21: 50 mg via ORAL

## 2016-08-21 MED ORDER — CHLORDIAZEPOXIDE HCL 25 MG PO CAPS
ORAL_CAPSULE | ORAL | Status: AC
  Filled 2016-08-21: qty 2

## 2016-08-21 MED ORDER — LORAZEPAM 2 MG/ML IJ SOLN
1.0000 mg | Freq: Once | INTRAMUSCULAR | Status: AC
  Administered 2016-08-21: 1 mg via INTRAVENOUS
  Filled 2016-08-21: qty 1

## 2016-08-21 NOTE — ED Notes (Signed)
Pt awoken from sleep for CIWA during pulse check.

## 2016-08-21 NOTE — ED Provider Notes (Signed)
Patient can be discharged. He has been stable.   Justin Schneider, Paul F, MD 08/21/16 [phone]

## 2016-08-21 NOTE — ED Notes (Signed)
Report to jerrie, rn. 

## 2016-08-21 NOTE — ED Notes (Signed)
Attempted to contact pt family x2

## 2016-08-21 NOTE — ED Notes (Signed)
Pt sleeping, no tremors felt, no sweating visible.

## 2016-08-21 NOTE — Progress Notes (Signed)
LCSW met with patient and provided [facility], [employer] and [facility] for furture treatment considerations. He reports he is sorry and will abstain.   LCSW consulted with TTS and EDRN and explained resources that were provided.  Justin Ismael LCSW

## 2016-08-21 NOTE — ED Notes (Signed)
Pt hypertensive and tachycardic, provider made aware, ordered to continue with discharge.

## 2016-10-25 ENCOUNTER — Emergency Department
Admission: EM | Admit: 2016-10-25 | Discharge: 2016-10-25 | Disposition: A | Discharge status: Home / Self Care | Payer: Self-pay | Attending: Emergency Medicine | Admitting: Emergency Medicine

## 2016-10-25 ENCOUNTER — Encounter: Payer: Self-pay | Admitting: *Deleted

## 2016-10-25 ENCOUNTER — Emergency Department: Payer: Self-pay

## 2016-10-25 DIAGNOSIS — S0990XA Unspecified injury of head, initial encounter: Secondary | ICD-10-CM | POA: Insufficient documentation

## 2016-10-25 DIAGNOSIS — F10929 Alcohol use, unspecified with intoxication, unspecified: Secondary | ICD-10-CM | POA: Insufficient documentation

## 2016-10-25 DIAGNOSIS — Y939 Activity, unspecified: Secondary | ICD-10-CM | POA: Insufficient documentation

## 2016-10-25 DIAGNOSIS — X58XXXA Exposure to other specified factors, initial encounter: Secondary | ICD-10-CM | POA: Insufficient documentation

## 2016-10-25 DIAGNOSIS — Y92410 Unspecified street and highway as the place of occurrence of the external cause: Secondary | ICD-10-CM | POA: Insufficient documentation

## 2016-10-25 DIAGNOSIS — Y999 Unspecified external cause status: Secondary | ICD-10-CM | POA: Insufficient documentation

## 2016-10-25 LAB — COMPREHENSIVE METABOLIC PANEL
ALBUMIN: 3.6 g/dL (ref 3.5–5.0)
ALK PHOS: 104 U/L (ref 38–126)
ALT: 55 U/L (ref 17–63)
ANION GAP: 9 (ref 5–15)
AST: 161 U/L — ABNORMAL HIGH (ref 15–41)
BUN: 6 mg/dL (ref 6–20)
CALCIUM: 8.1 mg/dL — AB (ref 8.9–10.3)
CO2: 22 mmol/L (ref 22–32)
Chloride: 111 mmol/L (ref 101–111)
Creatinine, Ser: 0.63 mg/dL (ref 0.61–1.24)
GFR calc Af Amer: >60 mL/min (ref >60)
GLUCOSE: 104 mg/dL — AB (ref 65–99)
POTASSIUM: 3.5 mmol/L (ref 3.5–5.1)
Sodium: 142 mmol/L (ref 135–145)
TOTAL PROTEIN: 7.3 g/dL (ref 6.5–8.1)
Total Bilirubin: 0.4 mg/dL (ref 0.3–1.2)

## 2016-10-25 LAB — CBC
HEMATOCRIT: 32.7 % — AB (ref 40.0–52.0)
HEMOGLOBIN: 10.3 g/dL — AB (ref 13.0–18.0)
MCH: 23.1 pg — AB (ref 26.0–34.0)
MCHC: 31.6 g/dL — ABNORMAL LOW (ref 32.0–36.0)
MCV: 73.1 fL — AB (ref 80.0–100.0)
PLATELETS: 133 10*3/uL — AB (ref 150–440)
RBC: 4.48 MIL/uL (ref 4.40–5.90)
RDW: 18.9 % — ABNORMAL HIGH (ref 11.5–14.5)
WBC: 4.5 10*3/uL (ref 3.8–10.6)

## 2016-10-25 LAB — ETHANOL: ALCOHOL ETHYL (B): 388 mg/dL — AB (ref <5)

## 2016-10-25 LAB — ACETAMINOPHEN LEVEL

## 2016-10-25 LAB — SALICYLATE LEVEL: Salicylate Lvl: <7 mg/dL (ref 2.8–30.0)

## 2016-10-25 NOTE — ED Triage Notes (Signed)
Pt brought in by [employer].  Pt ws found lying in the street  Intoxicated.  Pt is IVC.  Pt calm and cooperative.

## 2016-10-25 NOTE — Discharge Instructions (Signed)
Fortunately today you're CT scans were normal, however you were extremely intoxicated. Drinking this much alcohol is dangerous for your health and I strongly recommend you consider stopping drinking alcohol.  It was a pleasure to take care of you today, and thank you for coming to our emergency department.  If you have any questions or concerns before leaving please ask the nurse to grab me and I'm more than happy to go through your aftercare instructions again.  If you were prescribed any opioid pain medication today such as Norco, Vicodin, Percocet, morphine, hydrocodone, or oxycodone please make sure you do not drive when you are taking this medication as it can alter your ability to drive safely.  If you have any concerns once you are home that you are not improving or are in fact getting worse before you can make it to your follow-up appointment, please do not hesitate to call [phone] and come back for further evaluation.  Merrily BrittleNeil Adanya Sosinski, MD  Results for orders placed or performed during the hospital encounter of 10/25/16  Comprehensive metabolic panel  Result Value Ref Range   Sodium 142 135 - 145 mmol/L   Potassium 3.5 3.5 - 5.1 mmol/L   Chloride 111 101 - 111 mmol/L   CO2 22 22 - 32 mmol/L   Glucose, Bld 104 (H) 65 - 99 mg/dL   BUN 6 6 - 20 mg/dL   Creatinine, Ser 1.610.63 0.61 - 1.24 mg/dL   Calcium 8.1 (L) 8.9 - 10.3 mg/dL   Total Protein 7.3 6.5 - 8.1 g/dL   Albumin 3.6 3.5 - 5.0 g/dL   AST 096161 (H) 15 - 41 U/L   ALT 55 17 - 63 U/L   Alkaline Phosphatase 104 38 - 126 U/L   Total Bilirubin 0.4 0.3 - 1.2 mg/dL   GFR calc non Af Amer >60 >60 mL/min   GFR calc Af Amer >60 >60 mL/min   Anion gap 9 5 - 15  Ethanol  Result Value Ref Range   Alcohol, Ethyl (B) 388 (HH) <5 mg/dL  Salicylate level  Result Value Ref Range   Salicylate Lvl <7.0 2.8 - 30.0 mg/dL  Acetaminophen level  Result Value Ref Range   Acetaminophen (Tylenol), Serum <10 (L) 10 - 30 ug/mL  cbc  Result Value Ref  Range   WBC 4.5 3.8 - 10.6 K/uL   RBC 4.48 4.40 - 5.90 MIL/uL   Hemoglobin 10.3 (L) 13.0 - 18.0 g/dL   HCT 04.532.7 (L) 40.940.0 - 81.152.0 %   MCV 73.1 (L) 80.0 - 100.0 fL   MCH 23.1 (L) 26.0 - 34.0 pg   MCHC 31.6 (L) 32.0 - 36.0 g/dL   RDW 91.418.9 (H) 78.211.5 - 95.614.5 %   Platelets 133 (L) 150 - 440 K/uL   Ct Head Wo Contrast  Result Date: 10/25/2016 CLINICAL DATA:  Found lying on street. Concern for head or cervical spine injury. Initial encounter. EXAM: CT HEAD WITHOUT CONTRAST CT CERVICAL SPINE WITHOUT CONTRAST TECHNIQUE: Multidetector CT imaging of the head and cervical spine was performed following the standard protocol without intravenous contrast. Multiplanar CT image reconstructions of the cervical spine were also generated. COMPARISON:  CT of the head performed 12/13/2014, and CT of the cervical spine performed 10/24/2013 FINDINGS: CT HEAD FINDINGS Brain: No evidence of acute infarction, hemorrhage, hydrocephalus, extra-axial collection or mass lesion/mass effect. Prominence of the ventricles and sulci reflects mild to moderate cortical volume loss. Cerebellar atrophy is noted. Scattered periventricular white matter change likely reflects small  vessel ischemic microangiopathy. The brainstem and fourth ventricle are within normal limits. The basal ganglia are unremarkable in appearance. The cerebral hemispheres demonstrate grossly normal gray-white differentiation. No mass effect or midline shift is seen. Vascular: No hyperdense vessel or unexpected calcification. Skull: There is no evidence of fracture; visualized osseous structures are unremarkable in appearance. Sinuses/Orbits: The visualized portions of the orbits are within normal limits. The paranasal sinuses and mastoid air cells are well-aerated. Other: No significant soft tissue abnormalities are seen. CT CERVICAL SPINE FINDINGS Alignment: Normal. Skull base and vertebrae: No acute fracture. No primary bone lesion or focal pathologic process. Soft tissues  and spinal canal: No prevertebral fluid or swelling. No visible canal hematoma. Disc levels: Intervertebral disc spaces are preserved. The bony foramina are grossly unremarkable. Upper chest: Visualized lung apices are clear. The thyroid gland is unremarkable in appearance. Other: No additional soft tissue abnormalities are seen. IMPRESSION: 1. No evidence of traumatic intracranial injury or fracture. 2. No evidence of fracture or subluxation along the cervical spine. 3. Mild to moderate cortical volume loss and scattered small vessel ischemic microangiopathy. Electronically Signed   By: Roanna Raider M.D.   On: 10/25/2016 02:45   Ct Cervical Spine Wo Contrast  Result Date: 10/25/2016 CLINICAL DATA:  Found lying on street. Concern for head or cervical spine injury. Initial encounter. EXAM: CT HEAD WITHOUT CONTRAST CT CERVICAL SPINE WITHOUT CONTRAST TECHNIQUE: Multidetector CT imaging of the head and cervical spine was performed following the standard protocol without intravenous contrast. Multiplanar CT image reconstructions of the cervical spine were also generated. COMPARISON:  CT of the head performed 12/13/2014, and CT of the cervical spine performed 10/24/2013 FINDINGS: CT HEAD FINDINGS Brain: No evidence of acute infarction, hemorrhage, hydrocephalus, extra-axial collection or mass lesion/mass effect. Prominence of the ventricles and sulci reflects mild to moderate cortical volume loss. Cerebellar atrophy is noted. Scattered periventricular white matter change likely reflects small vessel ischemic microangiopathy. The brainstem and fourth ventricle are within normal limits. The basal ganglia are unremarkable in appearance. The cerebral hemispheres demonstrate grossly normal gray-white differentiation. No mass effect or midline shift is seen. Vascular: No hyperdense vessel or unexpected calcification. Skull: There is no evidence of fracture; visualized osseous structures are unremarkable in appearance.  Sinuses/Orbits: The visualized portions of the orbits are within normal limits. The paranasal sinuses and mastoid air cells are well-aerated. Other: No significant soft tissue abnormalities are seen. CT CERVICAL SPINE FINDINGS Alignment: Normal. Skull base and vertebrae: No acute fracture. No primary bone lesion or focal pathologic process. Soft tissues and spinal canal: No prevertebral fluid or swelling. No visible canal hematoma. Disc levels: Intervertebral disc spaces are preserved. The bony foramina are grossly unremarkable. Upper chest: Visualized lung apices are clear. The thyroid gland is unremarkable in appearance. Other: No additional soft tissue abnormalities are seen. IMPRESSION: 1. No evidence of traumatic intracranial injury or fracture. 2. No evidence of fracture or subluxation along the cervical spine. 3. Mild to moderate cortical volume loss and scattered small vessel ischemic microangiopathy. Electronically Signed   By: Roanna Raider M.D.   On: 10/25/2016 02:45

## 2016-10-25 NOTE — ED Notes (Signed)
Patient came in IVC'ed at and IVC was resided shortly after by Dr. Lamont Snowballifenbark.

## 2016-10-25 NOTE — ED Provider Notes (Addendum)
[facility] Emergency Department Provider Note  ____________________________________________   First MD Initiated Contact with Patient 10/25/16 0150     (approximate)  I have reviewed the triage vital signs and the nursing notes.   HISTORY  Chief Complaint Behavior Problem and Alcohol Intoxication  Level V exemption history Limited by the patient's alcohol intoxication  HPI Justin Schneider is a 51 y.o. male who comes to the emergency department on an involuntary commitment from [facility] police after she was found lying in the street intoxicated. The patient himself denies any complaints. He said he drank 3 beers today. He said he did not fall down. He denies headache chest pain shortness of breath abdominal pain nausea vomiting no vision or blurred vision. He denies suicidality. He said he is only an emergency department because "they brought me" and he doesn't particularly want to be here.   Past Medical History:  Diagnosis Date  . Alcoholism /alcohol abuse (HCC)   . Unknown and unspecified causes of morbidity     There are no active problems to display for this patient.   Past Surgical History:  Procedure Laterality Date  . unknown      Prior to Admission medications   Not on File    Allergies Patient has no known allergies.  No family history on file.  Social History Social History  Substance Use Topics  . Smoking status: Never Smoker  . Smokeless tobacco: Never Used  . Alcohol use Yes    Review of Systems Level V exemption history Limited by the patient's intoxication  ____________________________________________   PHYSICAL EXAM:  VITAL SIGNS: ED Triage Vitals  Enc Vitals Group     BP 10/25/16 0117 (!) 157/96     Pulse Rate 10/25/16 0117 98     Resp 10/25/16 0117 20     Temp 10/25/16 0117 98.8 F (37.1 C)     Temp Source 10/25/16 0117 Oral     SpO2 10/25/16 0117 96 %     Weight 10/25/16 0114 165 lb (74.8 kg)   Height 10/25/16 0114 5\' 8"  (1.727 m)     Head Circumference --      Peak Flow --      Pain Score --      Pain Loc --      Pain Edu? --      Excl. in GC? --     Constitutional: Pleasant cooperative clearly intoxicated and slurring his speech with heavy alcohol on his breath Eyes: PERRL EOMI. Head: Abrasion to forehead and top of nose unclear if they are acute or subacute. Nose: No congestion/rhinnorhea. Mouth/Throat: No trismus Neck: No stridor.   Cardiovascular: Normal rate, regular rhythm. Grossly normal heart sounds.  Good peripheral circulation. Respiratory: Normal respiratory effort.  No retractions. Lungs CTAB and moving good air Gastrointestinal: Soft nontender Musculoskeletal: No lower extremity edema   Neurologic:  Slurred speech heavily intoxicated. No gross focal neurologic deficits are appreciated. Skin:  Skin is warm, dry and intact. No rash noted. Psychiatric: Pleasant and not suicidal    ____________________________________________   DIFFERENTIAL includes but not limited to  Metabolic arrangement, drug overdose, alcohol intoxication, intracerebral hemorrhage, cervical spine fracture ____________________________________________   LABS (all labs ordered are listed, but only abnormal results are displayed)  Labs Reviewed  COMPREHENSIVE METABOLIC PANEL - Abnormal; Notable for the following:       Result Value   Glucose, Bld 104 (*)    Calcium 8.1 (*)    AST 161 (*)  All other components within normal limits  ETHANOL - Abnormal; Notable for the following:    Alcohol, Ethyl (B) 388 (*)    All other components within normal limits  ACETAMINOPHEN LEVEL - Abnormal; Notable for the following:    Acetaminophen (Tylenol), Serum <10 (*)    All other components within normal limits  CBC - Abnormal; Notable for the following:    Hemoglobin 10.3 (*)    HCT 32.7 (*)    MCV 73.1 (*)    MCH 23.1 (*)    MCHC 31.6 (*)    RDW 18.9 (*)    Platelets 133 (*)    All  other components within normal limits  SALICYLATE LEVEL  URINE DRUG SCREEN, QUALITATIVE ([facility] ONLY)    Significantly elevated ethanol level labs otherwise unremarkable __________________________________________  EKG   ____________________________________________  RADIOLOGY  Head CT with atrophy advanced for the patient's age but no acute disease C-spine CT no acute disease ____________________________________________   PROCEDURES  Procedure(s) performed: no  Procedures  Critical Care performed: no  Observation: no ____________________________________________   INITIAL IMPRESSION / ASSESSMENT AND PLAN / ED COURSE  Pertinent labs & imaging results that were available during my care of the patient were reviewed by me and considered in my medical decision making (see chart for details).  The patient arrives heavily intoxicated although very pleasant. He has acute to subacute trauma to his face given his intoxication CT scans were obtained which are fortunately negative for acute pathology. The patient denies suicidality and he agrees to stay in the emergency department overnight until he achieves clinical sobriety. Involuntary commitment papers have been rescinded.      _________----------------------------------------- 6:23 AM on 10/25/2016 -----------------------------------------  The patient is now clinically sober no longer slurring his words able to ambulate and tolerating orals. He is not withdrawing from alcohol. He is medically stable for outpatient management. ___________________________________   FINAL CLINICAL IMPRESSION(S) / ED DIAGNOSES  Final diagnoses:  Acute alcoholic intoxication with complication (HCC)      NEW MEDICATIONS STARTED DURING THIS VISIT:  New Prescriptions   No medications on file     Note:  This document was prepared using Dragon voice recognition software and may include unintentional dictation errors.     Merrily Brittleifenbark,  Tarea Skillman, MD 10/25/16 [id number]    Merrily Brittleifenbark, Teren Zurcher, MD 10/25/16 [id number]

## 2016-10-25 NOTE — ED Notes (Signed)
Date and time results received: 10/25/16 0447   Test: ETOH Critical Value: 388  Name of Provider Notified: Dr. Lamont Snowballifenbark

## 2016-12-04 ENCOUNTER — Emergency Department
Admission: EM | Admit: 2016-12-04 | Discharge: 2016-12-05 | Discharge status: Against Medical Advice | Payer: Self-pay | Attending: Emergency Medicine | Admitting: Emergency Medicine

## 2016-12-04 DIAGNOSIS — F10929 Alcohol use, unspecified with intoxication, unspecified: Secondary | ICD-10-CM

## 2016-12-04 DIAGNOSIS — R000 Tachycardia, unspecified: Secondary | ICD-10-CM | POA: Insufficient documentation

## 2016-12-04 DIAGNOSIS — F10920 Alcohol use, unspecified with intoxication, uncomplicated: Secondary | ICD-10-CM | POA: Insufficient documentation

## 2016-12-04 LAB — BASIC METABOLIC PANEL
Anion gap: 11 (ref 5–15)
BUN: 8 mg/dL (ref 6–20)
CHLORIDE: 100 mmol/L — AB (ref 101–111)
CO2: 28 mmol/L (ref 22–32)
Calcium: 9.5 mg/dL (ref 8.9–10.3)
Creatinine, Ser: 0.65 mg/dL (ref 0.61–1.24)
GFR calc Af Amer: >60 mL/min (ref >60)
GFR calc non Af Amer: >60 mL/min (ref >60)
Glucose, Bld: 111 mg/dL — ABNORMAL HIGH (ref 65–99)
POTASSIUM: 4 mmol/L (ref 3.5–5.1)
SODIUM: 139 mmol/L (ref 135–145)

## 2016-12-04 LAB — CBC
HEMATOCRIT: 38.5 % — AB (ref 40.0–52.0)
HEMOGLOBIN: 12.5 g/dL — AB (ref 13.0–18.0)
MCH: 23.2 pg — AB (ref 26.0–34.0)
MCHC: 32.4 g/dL (ref 32.0–36.0)
MCV: 71.6 fL — ABNORMAL LOW (ref 80.0–100.0)
Platelets: 234 10*3/uL (ref 150–440)
RBC: 5.38 MIL/uL (ref 4.40–5.90)
RDW: 19.6 % — ABNORMAL HIGH (ref 11.5–14.5)
WBC: 9.1 10*3/uL (ref 3.8–10.6)

## 2016-12-04 LAB — ETHANOL: Alcohol, Ethyl (B): 504 mg/dL (ref <5)

## 2016-12-04 NOTE — ED Notes (Signed)
Pt is soaked in rain water and this RN is concerned about possible hypothermia as a result of laying in cold, wet clothes. Pt's wet clothing removed, and patient dressed out in purple paper scrubs; bed linens changed; pt's clothing and shoes placed in a bag and placed next to stretcher.

## 2016-12-04 NOTE — ED Triage Notes (Addendum)
Pt arrives to ED via [facility] with c/o ETOH intoxication. PD reports pt was found in the middle of [address] in the rain. Pt gives an address that may or may not be his actual residence. Pt is alert, but severely intoxicated. RR even, regular, and unlabored; skin color/temp WNL.

## 2016-12-04 NOTE — ED Provider Notes (Signed)
[facility] Emergency Department Provider Note  ____________________________________________  Time seen: Approximately 11:06 PM  I have reviewed the triage vital signs and the nursing notes.   HISTORY  Chief Complaint Alcohol Intoxication  Level 5 caveat:  Portions of the history and physical were unable to be obtained due to intoxication   HPI Justin Schneider is a 51 y.o. male with a history of alcohol abuse and several prior visits to the emergency room all intoxicated who was brought in by policeafter being found intoxicated in the street. Patient will open his eyes to sternal rub but will not answer any questions at this time. There are no signs of trauma.  Past Medical History:  Diagnosis Date  . Alcoholism /alcohol abuse (HCC)   . Unknown and unspecified causes of morbidity     There are no active problems to display for this patient.   Past Surgical History:  Procedure Laterality Date  . unknown      Prior to Admission medications   Not on File    Allergies Patient has no known allergies.  No family history on file.  Social History Social History  Substance Use Topics  . Smoking status: Never Smoker  . Smokeless tobacco: Never Used  . Alcohol use Yes    Review of Systems Level 5 caveat:  Portions of the history and physical were unable to be obtained due to intoxication  ____________________________________________   PHYSICAL EXAM:  VITAL SIGNS: ED Triage Vitals  Enc Vitals Group     BP 12/04/16 2038 (!) 132/96     Pulse Rate 12/04/16 2038 78     Resp 12/04/16 2038 10     Temp 12/04/16 2038 97.8 F (36.6 C)     Temp Source 12/04/16 2038 Oral     SpO2 12/04/16 2038 97 %     Weight 12/04/16 2035 165 lb (74.8 kg)     Height 12/04/16 2035  (1.702 m)     Head Circumference --      Peak Flow --      Pain Score 12/04/16 2035 0     Pain Loc --      Pain Edu? --      Excl. in GC? --     Constitutional:  Intoxicated, no distress HEENT:      Head: Normocephalic and atraumatic.         Eyes: Conjunctivae are normal. Sclera is non-icteric.       Mouth/Throat: Mucous membranes are moist.       Neck: Supple with no signs of meningismus. Cardiovascular: Regular rate and rhythm. No murmurs, gallops, or rubs.  Respiratory: Normal respiratory effort. Lungs are clear to auscultation bilaterally. No wheezes, crackles, or rhonchi.  Gastrointestinal: Soft, non distended with positive bowel sounds. Musculoskeletal: Nontender with normal range of motion in all extremities. No edema, cyanosis, or erythema of extremities. Neurologic: Opens eyes to sternum rub, localizes to pain, mumbling, face is symmetric, moves all extremities Skin: Skin is warm, dry and intact. No rash noted.  ____________________________________________   LABS (all labs ordered are listed, but only abnormal results are displayed)  Labs Reviewed  ETHANOL - Abnormal; Notable for the following:       Result Value   Alcohol, Ethyl (B) 504 (*)    All other components within normal limits  CBC - Abnormal; Notable for the following:    Hemoglobin 12.5 (*)    HCT 38.5 (*)    MCV 71.6 (*)  MCH 23.2 (*)    RDW 19.6 (*)    All other components within normal limits  BASIC METABOLIC PANEL - Abnormal; Notable for the following:    Chloride 100 (*)    Glucose, Bld 111 (*)    All other components within normal limits   ____________________________________________  EKG  none  ____________________________________________  RADIOLOGY  none  ____________________________________________   PROCEDURES  Procedure(s) performed: None Procedures Critical Care performed:  None ____________________________________________   INITIAL IMPRESSION / ASSESSMENT AND PLAN / ED COURSE  51 y.o. male with a history of alcohol abuse and several prior visits to the emergency room all intoxicated who was brought in by policeafter being found  intoxicated in the street. Ethanol level 504. No signs of trauma. Patient breathing and HD stable. End tidal placed and patient connected to telemetry. No metabolic acidosis. Will monitor until patient is sober. Care transferred to Dr. Lenard Lance     Pertinent labs & imaging results that were available during my care of the patient were reviewed by me and considered in my medical decision making (see chart for details).    ____________________________________________   FINAL CLINICAL IMPRESSION(S) / ED DIAGNOSES  Final diagnoses:  Alcoholic intoxication with complication (HCC)      NEW MEDICATIONS STARTED DURING THIS VISIT:  New Prescriptions   No medications on file     Note:  This document was prepared using Dragon voice recognition software and may include unintentional dictation errors.    Nita Sickle, MD 12/04/16 [phone]

## 2016-12-05 NOTE — ED Notes (Signed)
This RN called Julieanne Manson, pt's emergency contact in regards to picking patient up or getting his brother's phone number per previous RN's note, Mikle Bosworth states he is still unable to pick patient up due to him working, however he did have patient's brother's phone number. This RN received permission from patient to call his brother Ronn Melena to come and get him.  [phone] Ronn Melena

## 2016-12-05 NOTE — ED Notes (Signed)
No change in patient condition. Pt visualized adjusting position in bed. VSS. Will continue to monitor for further patient needs.

## 2016-12-05 NOTE — ED Notes (Signed)
Pt noted to have tremors at time of D/C. Pt taken to lobby in wheelchair. Larena Sox, Interpreter at bedside for review of D/C instructions. This RN reviewed with patient once again that leaving in condition that he was in could result in hallucinations, seizures, and/or death. Pt states understanding at this time

## 2016-12-05 NOTE — ED Notes (Signed)
Dr. Cyril Loosen at bedside with interpreter to speak with patient and family about detox and alcohol withdrawal and how dangerous it is, this RN also at bedside at this time. Pt states he understands the risks of leaving in alcohol withdrawal.

## 2016-12-05 NOTE — ED Provider Notes (Signed)
-----------------------------------------   6:41 AM on 12/05/2016 -----------------------------------------  Patient appears to be much more sober than earlier tonight. Patient is able to speak/converse, states a family member can come pick him up.we will discharge the patient once a family member can pick the patient up and take him into their care.   Minna Antis, MD 12/05/16 [phone]

## 2016-12-05 NOTE — ED Notes (Signed)
Pt resting in bed with eyes closed. Respirations even and unlabored, skin warm, dry, and intact Will continue to monitor for further patient needs.

## 2016-12-05 NOTE — ED Provider Notes (Addendum)
Family member has finally come to pick up the patient   Justin Every, MD 12/05/16 1410   ----------------------------------------- 2:43 PM on 12/05/2016 -----------------------------------------  On reexam prior to discharge the patient is significantly tremulous and somewhat tachycardic consistent with mild withdrawal. I recommended to the patient that we have him seen by behavioral for likely alcohol detox program potentially [facility]. The patient has refused and states that he has to work. He understands that he is accepting the risk of alcohol withdrawal including seizures, delirium tremens. He knows that he can return any time    Justin Every, MD 12/05/16 1444

## 2016-12-05 NOTE — ED Notes (Signed)
No change in patient condition. Pt resting in bed. Attempted to call patient a ride with no success. Will continue to monitor for further patient needs.

## 2016-12-05 NOTE — ED Notes (Signed)
Pt visualized in NAD at this time. Pt resting in bed. VSS. Awaiting call back confirming patient ride. Will continue to monitor for further patient needs.

## 2016-12-05 NOTE — ED Notes (Signed)
Pt awake, pt sat up, this RN to bedside, pt is noted tobe tachycardic at 127, MD notified, no new orders received. Per MD pt is okay to eat and drink. Pt is noted to have +ETOH smell. Pt states he wants to go home. This RN explained patient needed to have a ride. Pt given water and a breakfast tray. Pt currently sitting up in bed eating at this time.

## 2016-12-05 NOTE — ED Notes (Signed)
Pt used urinal, 600 cc output. Attempted to call patient's brother again without success. Will continue to monitor for further patient needs.

## 2016-12-05 NOTE — ED Notes (Signed)
This RN called pt's brother Ronn Melena at [phone] and left HIPPA compliant voicemail requesting a callback.

## 2016-12-05 NOTE — ED Notes (Signed)
Attempted to call Julieanne Manson, pt's emergency contact, [phone], no answer, also attempted to call pt's brother Ronn Melena [phone]. Pt's brother answered phone, states he will call back in approx 5 minutes to let this RN know when he will be able to pick patient up.

## 2016-12-05 NOTE — ED Notes (Signed)
Pt used urinal again, 600cc output at this time. Attempted to ambulate patient, pt is noted to be tremulous and unsteady on his feet. Will continue to monitor for further patient needs. Will give patient a lunch tray and more water.

## 2016-12-05 NOTE — ED Notes (Addendum)
Patient's Emergency Contact, Julieanne Manson, called at [phone], to inquire if he or someone else is able to come and pick up the patient from the emergency room. Justin Schneider states that he is working right now, and he cannot come at this time to pick up the patient. When asked if there is anyone else available to come and pick up the patient, Justin Schneider states that the patient's brother can come. When asked if he has the name and number for the patient's brother, Justin Schneider states he will look for it and call back.

## 2016-12-05 NOTE — ED Notes (Signed)
Pt continues to rest in bed. Respirations even and unlabored. Will continue to monitor for further patient needs.

## 2016-12-05 NOTE — ED Notes (Signed)
This RN attempted to call patient's brother Ronn Melena at [phone], left a HIPPA compliant message requesting him to call back. Pt visualized in NAD. Will continue to monitor for further patient needs.

## 2016-12-05 NOTE — ED Notes (Signed)
Pt's brother arrived to pick patient up. Pt changed into blue paper scrubs. Pt is noted to have severe tremors and be unsteady on his feet as well as tachycardia and hypertension. Pt is also noted to be somewhat diaphoretic at this time.

## 2016-12-05 NOTE — ED Notes (Signed)
Pt given meal tray and water, moved to [facility] to be monitored and wait for his ride.

## 2017-03-05 ENCOUNTER — Emergency Department
Admission: EM | Admit: 2017-03-05 | Discharge: 2017-03-06 | Disposition: A | Discharge status: Home / Self Care | Payer: Self-pay | Attending: Emergency Medicine | Admitting: Emergency Medicine

## 2017-03-05 ENCOUNTER — Encounter: Payer: Self-pay | Admitting: Emergency Medicine

## 2017-03-05 DIAGNOSIS — F1010 Alcohol abuse, uncomplicated: Secondary | ICD-10-CM

## 2017-03-05 DIAGNOSIS — F10120 Alcohol abuse with intoxication, uncomplicated: Secondary | ICD-10-CM | POA: Insufficient documentation

## 2017-03-05 DIAGNOSIS — H11151 Pinguecula, right eye: Secondary | ICD-10-CM

## 2017-03-05 DIAGNOSIS — F10920 Alcohol use, unspecified with intoxication, uncomplicated: Secondary | ICD-10-CM

## 2017-03-05 DIAGNOSIS — F10929 Alcohol use, unspecified with intoxication, unspecified: Secondary | ICD-10-CM

## 2017-03-05 LAB — CBC WITH DIFFERENTIAL/PLATELET
Basophils Absolute: 0.2 10*3/uL — ABNORMAL HIGH (ref 0–0.1)
Basophils Relative: 3 %
Eosinophils Absolute: 1.1 10*3/uL — ABNORMAL HIGH (ref 0–0.7)
Eosinophils Relative: 22 %
HCT: 38 % — ABNORMAL LOW (ref 40.0–52.0)
Hemoglobin: 12.2 g/dL — ABNORMAL LOW (ref 13.0–18.0)
Lymphocytes Relative: 39 %
Lymphs Abs: 2 10*3/uL (ref 1.0–3.6)
MCH: 23.7 pg — ABNORMAL LOW (ref 26.0–34.0)
MCHC: 32.2 g/dL (ref 32.0–36.0)
MCV: 73.7 fL — ABNORMAL LOW (ref 80.0–100.0)
Monocytes Absolute: 0.3 10*3/uL (ref 0.2–1.0)
Monocytes Relative: 5 %
Neutro Abs: 1.6 10*3/uL (ref 1.4–6.5)
Neutrophils Relative %: 31 %
Platelets: 177 10*3/uL (ref 150–440)
RBC: 5.16 MIL/uL (ref 4.40–5.90)
RDW: 19.7 % — ABNORMAL HIGH (ref 11.5–14.5)
WBC: 5.1 10*3/uL (ref 3.8–10.6)

## 2017-03-05 LAB — COMPREHENSIVE METABOLIC PANEL
ALBUMIN: 4.4 g/dL (ref 3.5–5.0)
ALT: 54 U/L (ref 17–63)
AST: 135 U/L — AB (ref 15–41)
Alkaline Phosphatase: 93 U/L (ref 38–126)
Anion gap: 13 (ref 5–15)
BUN: 6 mg/dL (ref 6–20)
CHLORIDE: 103 mmol/L (ref 101–111)
CO2: 24 mmol/L (ref 22–32)
Calcium: 9.1 mg/dL (ref 8.9–10.3)
Creatinine, Ser: 0.65 mg/dL (ref 0.61–1.24)
GFR calc Af Amer: >60 mL/min (ref >60)
GFR calc non Af Amer: >60 mL/min (ref >60)
GLUCOSE: 100 mg/dL — AB (ref 65–99)
POTASSIUM: 3.7 mmol/L (ref 3.5–5.1)
SODIUM: 140 mmol/L (ref 135–145)
Total Bilirubin: 0.5 mg/dL (ref 0.3–1.2)
Total Protein: 8.5 g/dL — ABNORMAL HIGH (ref 6.5–8.1)

## 2017-03-05 LAB — ETHANOL: Alcohol, Ethyl (B): 368 mg/dL (ref <10)

## 2017-03-05 LAB — SALICYLATE LEVEL: Salicylate Lvl: <7 mg/dL (ref 2.8–30.0)

## 2017-03-05 LAB — ACETAMINOPHEN LEVEL

## 2017-03-05 NOTE — ED Triage Notes (Signed)
Patient states that he was brought in by [employer] because he was in the street drinking. Interpreter used but unable to understand patient and what he is here for at this time. Patient states that he wants help but does not say what for.

## 2017-03-05 NOTE — ED Notes (Signed)
Pt given urine cup upon arrival to bed. Pt ambulated to BR with cup and came back with unopened cup.

## 2017-03-05 NOTE — ED Provider Notes (Signed)
[facility] Emergency Department Provider Note   ____________________________________________   First MD Initiated Contact with Patient 03/05/17 2039     (approximate)  I have reviewed the triage vital signs and the nursing notes.   HISTORY  Chief Complaint Alcohol Intoxication  history limited by extreme alcohol intoxication  HPI Justin Schneider is a 51 y.o. male Who comes in with a history of multiple visits of alcohol intoxication one time with an alcohol level of 504. He comes [place] today [place] [facility] [place] he was in the street drinking. Interpreter and nurses cannot understand what he is saying but one of the other nurses reports this is his baseline is very drunk. Review of the old records seems to confirm this.  Past Medical History:  Diagnosis Date  . Alcoholism /alcohol abuse (HCC)   . Unknown and unspecified causes of morbidity     There are no active problems to display for this patient.   Past Surgical History:  Procedure Laterality Date  . unknown      Prior to Admission medications   Not on File    Allergies Patient has no known allergies.  No family history on file.  Social History Social History   Tobacco Use  . Smoking status: Never Smoker  . Smokeless tobacco: Never Used  Substance Use Topics  . Alcohol use: Yes  . Drug use: No    Review of Systems quality is a review of systems is compromised by his intoxication Constitutional: No fever/chills Eyes: difficulty seeing out of right eye uncertain how long this has lasted. ENT: No sore throat. Cardiovascular: Denies chest pain. Respiratory: Denies shortness of breath. Gastrointestinal: No abdominal pain.  No nausea, no vomiting.  No diarrhea.  No constipation. Genitourinary: Negative for dysuria. Musculoskeletal: Negative for back pain. Skin: Negative for rash. Neurological: Negative for headaches, focal weakness    ____________________________________________   PHYSICAL EXAM:  VITAL SIGNS: ED Triage Vitals  Enc Vitals Group     BP 03/05/17 2025 (!) 162/88     Pulse Rate 03/05/17 2025 (!) 105     Resp 03/05/17 2025 18     Temp 03/05/17 2025 98.4 F (36.9 C)     Temp Source 03/05/17 2025 Oral     SpO2 03/05/17 2025 94 %     Weight 03/05/17 2026 180 lb (81.6 kg)     Height 03/05/17 2026 5\' 4"  (1.626 m)     Head Circumference --      Peak Flow --      Pain Score --      Pain Loc --      Pain Edu? --      Excl. in GC? --    Constitutional: Alert and oriented.  Eyes: conjunctivae were injected bilaterally right is worse than the left. Patient has bilateral pinguecula. Head: Atraumatic. Nose: No congestion/rhinnorhea. Mouth/Throat: Mucous membranes are moist.  Oropharynx non-erythematous. Neck: No stridor. Cardiovascular: Normal rate, regular rhythm. Grossly normal heart sounds.  Good peripheral circulation. Respiratory: Normal respiratory effort.  No retractions. Lungs CTAB. Gastrointestinal: Soft and nontender. No distention. No abdominal bruits. No CVA tenderness. Musculoskeletal: No lower extremity tenderness nor edema.  No joint effusions. Neurologic:  Normal speech and language. No gross focal neurologic deficits are appreciated. No gait instability. Skin:  Skin is warm, dry and intact. No rash noted.   ____________________________________________   LABS (all labs ordered are listed, but only abnormal results are displayed)  Labs  Reviewed  ACETAMINOPHEN LEVEL - Abnormal; Notable for the following components:      Result Value   Acetaminophen (Tylenol), Serum <10 (*)    All other components within normal limits  COMPREHENSIVE METABOLIC PANEL - Abnormal; Notable for the following components:   Glucose, Bld 100 (*)    Total Protein 8.5 (*)    AST 135 (*)    All other components within normal limits  ETHANOL - Abnormal; Notable for the following components:   Alcohol, Ethyl  (B) 368 (*)    All other components within normal limits  CBC WITH DIFFERENTIAL/PLATELET - Abnormal; Notable for the following components:   Hemoglobin 12.2 (*)    HCT 38.0 (*)    MCV 73.7 (*)    MCH 23.7 (*)    RDW 19.7 (*)    Eosinophils Absolute 1.1 (*)    Basophils Absolute 0.2 (*)    All other components within normal limits  SALICYLATE LEVEL  URINALYSIS, COMPLETE ([facility]) WITH MICROSCOPIC  URINE DRUG SCREEN, QUALITATIVE ([facility] ONLY)   ____________________________________________  EKG   ____________________________________________  RADIOLOGY   ____________________________________________   PROCEDURES  Procedure(s) performed:   Procedures  Critical Care performed:   ____________________________________________   INITIAL IMPRESSION / ASSESSMENT AND PLAN / ED COURSE  patient is very intoxicated. We'll wait till his intoxication improves to see if he can provide us with a better history of what's going on with his right eye. We'll then have him see the eye doctor. His eyes do not appear to be painful as I understand what he is telling me.      ____________________________________________   FINAL CLINICAL IMPRESSION(S) / ED DIAGNOSES  Final diagnoses:  Alcoholic intoxication with complication (HCC)  ETOH abuse  Alcoholic intoxication without complication [facility](HCC)     ED Discharge Orders    None       Note:  This document was prepared using Dragon voice recognition software and may include unintentional dictation errors.    Arnaldo NatalMalinda, Alzina Golda F, MD 03/05/17 [phone]

## 2017-03-06 LAB — URINALYSIS, COMPLETE (UACMP) WITH MICROSCOPIC
BACTERIA UA: NONE SEEN
BILIRUBIN URINE: NEGATIVE
GLUCOSE, UA: NEGATIVE mg/dL
HGB URINE DIPSTICK: NEGATIVE
KETONES UR: NEGATIVE mg/dL
NITRITE: NEGATIVE
PH: 5 (ref 5.0–8.0)
PROTEIN: NEGATIVE mg/dL
Specific Gravity, Urine: 1.006 (ref 1.005–1.030)

## 2017-03-06 LAB — URINE DRUG SCREEN, QUALITATIVE (ARMC ONLY)
Amphetamines, Ur Screen: NOT DETECTED
BARBITURATES, UR SCREEN: NOT DETECTED
BENZODIAZEPINE, UR SCRN: NOT DETECTED
CANNABINOID 50 NG, UR ~~LOC~~: NOT DETECTED
Cocaine Metabolite,Ur ~~LOC~~: NOT DETECTED
MDMA (Ecstasy)Ur Screen: NOT DETECTED
Methadone Scn, Ur: NOT DETECTED
OPIATE, UR SCREEN: NOT DETECTED
PHENCYCLIDINE (PCP) UR S: NOT DETECTED
Tricyclic, Ur Screen: NOT DETECTED

## 2017-03-06 MED ORDER — CHLORDIAZEPOXIDE HCL 25 MG PO CAPS
50.0000 mg | ORAL_CAPSULE | Freq: Once | ORAL | Status: AC
  Administered 2017-03-06: 50 mg via ORAL
  Filled 2017-03-06: qty 2

## 2017-03-06 NOTE — ED Notes (Signed)
BEHAVIORAL HEALTH ROUNDING Patient sleeping: Yes.   Patient alert and oriented: not applicable SLEEPING Behavior appropriate: Yes.  ; If no, describe: SLEEPING Nutrition and fluids offered: No SLEEPING Toileting and hygiene offered: NoSLEEPING Sitter present: not applicable, Q 15 min safety rounds and observation. Law enforcement present: Yes ODS 

## 2017-03-06 NOTE — ED Provider Notes (Signed)
The patient is now clinically sober.  He does not want to stop drinking alcohol.  He is mildly tremulous so we will give him chlordiazepoxide.  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  He is discharged home in improved condition verbalized understanding and agree with plan.   Merrily Brittleifenbark, Eulalia Ellerman, MD 03/06/17 [phone]

## 2017-03-06 NOTE — ED Notes (Signed)
BEHAVIORAL HEALTH ROUNDING  Patient sleeping: No.  Patient alert and oriented: yes  Behavior appropriate: Yes. ; If no, describe:  Nutrition and fluids offered: Yes  Toileting and hygiene offered: Yes  Sitter present: not applicable, Q 15 min safety rounds and observation.  Law enforcement present: Yes ODS  

## 2017-03-06 NOTE — ED Notes (Signed)

## 2017-03-06 NOTE — ED Provider Notes (Signed)
-----------------------------------------   9:00 AM on 03/06/2017 -----------------------------------------  Patient appears well, has eaten, sitting up in bed, says he is ready to go home.  Patient's medical workup has been largely nonrevealing besides mild AST elevation likely due to alcoholism.  Alcohol level of 368 at 9 PM last night, 12 hours ago.  I believe the patient is safe for discharge home at this time.   Minna AntisPaduchowski, Dominion Kathan, MD 03/06/17 0900

## 2017-03-06 NOTE — Discharge Instructions (Signed)
Please make an appointment to follow-up with the eye doctor regarding your right eye.  Return to the emergency department sooner for any concerns.  It was a pleasure to take care of you today, and thank you for coming to our emergency department.  If you have any questions or concerns before leaving please ask the nurse to grab me and I'm more than happy to go through your aftercare instructions again.  If you were prescribed any opioid pain medication today such as Norco, Vicodin, Percocet, morphine, hydrocodone, or oxycodone please make sure you do not drive when you are taking this medication as it can alter your ability to drive safely.  If you have any concerns once you are home that you are not improving or are in fact getting worse before you can make it to your follow-up appointment, please do not hesitate to call [phone] and come back for further evaluation.  Merrily BrittleNeil Lyrika Souders, MD  Results for orders placed or performed during the hospital encounter of 03/05/17  Acetaminophen level  Result Value Ref Range   Acetaminophen (Tylenol), Serum <10 (L) 10 - 30 ug/mL  Comprehensive metabolic panel  Result Value Ref Range   Sodium 140 135 - 145 mmol/L   Potassium 3.7 3.5 - 5.1 mmol/L   Chloride 103 101 - 111 mmol/L   CO2 24 22 - 32 mmol/L   Glucose, Bld 100 (H) 65 - 99 mg/dL   BUN 6 6 - 20 mg/dL   Creatinine, Ser 1.610.65 0.61 - 1.24 mg/dL   Calcium 9.1 8.9 - 09.610.3 mg/dL   Total Protein 8.5 (H) 6.5 - 8.1 g/dL   Albumin 4.4 3.5 - 5.0 g/dL   AST 045135 (H) 15 - 41 U/L   ALT 54 17 - 63 U/L   Alkaline Phosphatase 93 38 - 126 U/L   Total Bilirubin 0.5 0.3 - 1.2 mg/dL   GFR calc non Af Amer >60 >60 mL/min   GFR calc Af Amer >60 >60 mL/min   Anion gap 13 5 - 15  Ethanol  Result Value Ref Range   Alcohol, Ethyl (B) 368 (HH) <10 mg/dL  CBC with Differential  Result Value Ref Range   WBC 5.1 3.8 - 10.6 K/uL   RBC 5.16 4.40 - 5.90 MIL/uL   Hemoglobin 12.2 (L) 13.0 - 18.0 g/dL   HCT 40.938.0 (L) 81.140.0 -  52.0 %   MCV 73.7 (L) 80.0 - 100.0 fL   MCH 23.7 (L) 26.0 - 34.0 pg   MCHC 32.2 32.0 - 36.0 g/dL   RDW 91.419.7 (H) 78.211.5 - 95.614.5 %   Platelets 177 150 - 440 K/uL   Neutrophils Relative % 31 %   Neutro Abs 1.6 1.4 - 6.5 K/uL   Lymphocytes Relative 39 %   Lymphs Abs 2.0 1.0 - 3.6 K/uL   Monocytes Relative 5 %   Monocytes Absolute 0.3 0.2 - 1.0 K/uL   Eosinophils Relative 22 %   Eosinophils Absolute 1.1 (H) 0 - 0.7 K/uL   Basophils Relative 3 %   Basophils Absolute 0.2 (H) 0 - 0.1 K/uL  Salicylate level  Result Value Ref Range   Salicylate Lvl <7.0 2.8 - 30.0 mg/dL

## 2017-03-06 NOTE — ED Notes (Addendum)

## 2017-03-06 NOTE — ED Notes (Signed)
Patient observed lying in bed with eyes closed  Even, unlabored respirations observed   NAD pt appears to be sleeping  I will continue to monitor along with every 15 minute visual observations and ongoing security monitoring
